# Patient Record
Sex: Male | Born: 1995 | Race: Black or African American | Hispanic: No | Marital: Single | Smoking: Never smoker
Health system: Southern US, Community
[De-identification: ages and names within clinical notes are randomized; demographics above are authoritative.]

## PROBLEM LIST (undated history)

## (undated) DIAGNOSIS — J9859 Other diseases of mediastinum, not elsewhere classified: Secondary | ICD-10-CM

---
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
## 2016-10-02 ENCOUNTER — Encounter: Payer: BLUE CROSS/BLUE SHIELD | Admitting: Emergency Medicine

## 2016-10-08 ENCOUNTER — Encounter: Payer: Self-pay | Admitting: Emergency Medicine

## 2016-10-08 ENCOUNTER — Ambulatory Visit (INDEPENDENT_AMBULATORY_CARE_PROVIDER_SITE_OTHER): Payer: BLUE CROSS/BLUE SHIELD | Admitting: Emergency Medicine

## 2016-10-08 ENCOUNTER — Encounter: Payer: BLUE CROSS/BLUE SHIELD | Admitting: Emergency Medicine

## 2016-10-08 VITALS — BP 114/68 | HR 63 | Temp 98.4°F | Resp 16 | Ht 75.0 in | Wt 184.0 lb

## 2016-10-08 DIAGNOSIS — Z0000 Encounter for general adult medical examination without abnormal findings: Secondary | ICD-10-CM | POA: Diagnosis not present

## 2016-10-08 NOTE — Patient Instructions (Addendum)
   IF you received an x-ray today, you will receive an invoice from [facility]. Please contact [facility] at [phone] with questions or concerns regarding your invoice.   IF you received labwork today, you will receive an invoice from [facility]. Please contact [facility] at [phone] with questions or concerns regarding your invoice.   Our billing staff will not be able to assist you with questions regarding bills from these companies.  You will be contacted with the lab results as soon as they are available. The fastest way to get your results is to activate your My Chart account. Instructions are located on the last page of this paperwork. If you have not heard from us regarding the results in 2 weeks, please contact this office.      Health Maintenance, Male A healthy lifestyle and preventive care is important for your health and wellness. Ask your health care provider about what schedule of regular examinations is right for you. What should I know about weight and diet? Eat a Healthy Diet  Eat plenty of vegetables, fruits, whole grains, low-fat dairy products, and lean protein.  Do not eat a lot of foods high in solid fats, added sugars, or salt.  Maintain a Healthy Weight Regular exercise can help you achieve or maintain a healthy weight. You should:  Do at least 150 minutes of exercise each week. The exercise should increase your heart rate and make you sweat (moderate-intensity exercise).  Do strength-training exercises at least twice a week.  Watch Your Levels of Cholesterol and Blood Lipids  Have your blood tested for lipids and cholesterol every 5 years starting at 20 years of age. If you are at high risk for heart disease, you should start having your blood tested when you are 20 years old. You may need to have your cholesterol levels checked more often if: ? Your lipid or cholesterol levels are high. ? You are older than 20 years of age. ? You  are at high risk for heart disease.  What should I know about cancer screening? Many types of cancers can be detected early and may often be prevented. Lung Cancer  You should be screened every year for lung cancer if: ? You are a current smoker who has smoked for at least 30 years. ? You are a former smoker who has quit within the past 15 years.  Talk to your health care provider about your screening options, when you should start screening, and how often you should be screened.  Colorectal Cancer  Routine colorectal cancer screening usually begins at 20 years of age and should be repeated every 5-10 years until you are 20 years old. You may need to be screened more often if early forms of precancerous polyps or small growths are found. Your health care provider may recommend screening at an earlier age if you have risk factors for colon cancer.  Your health care provider may recommend using home test kits to check for hidden blood in the stool.  A small camera at the end of a tube can be used to examine your colon (sigmoidoscopy or colonoscopy). This checks for the earliest forms of colorectal cancer.  Prostate and Testicular Cancer  Depending on your age and overall health, your health care provider may do certain tests to screen for prostate and testicular cancer.  Talk to your health care provider about any symptoms or concerns you have about testicular or prostate cancer.  Skin Cancer  Check your skin   your skin from head to toe regularly.  Tell your health care provider about any new moles or changes in moles, especially if: ? There is a change in a mole's size, shape, or color. ? You have a mole that is larger than a pencil eraser.  Always use sunscreen. Apply sunscreen liberally and repeat throughout the day.  Protect yourself by wearing long sleeves, pants, a wide-brimmed hat, and sunglasses when outside.  What should I know about heart disease, diabetes, and high  blood pressure?  If you are 7-68 years of age, have your blood pressure checked every 3-5 years. If you are 60 years of age or older, have your blood pressure checked every year. You should have your blood pressure measured twice-once when you are at a hospital or clinic, and once when you are not at a hospital or clinic. Record the average of the two measurements. To check your blood pressure when you are not at a hospital or clinic, you can use: ? An automated blood pressure machine at a pharmacy. ? A home blood pressure monitor.  Talk to your health care provider about your target blood pressure.  If you are between 34-21 years old, ask your health care provider if you should take aspirin to prevent heart disease.  Have regular diabetes screenings by checking your fasting blood sugar level. ? If you are at a normal weight and have a low risk for diabetes, have this test once every three years after the age of 28. ? If you are overweight and have a high risk for diabetes, consider being tested at a younger age or more often.  A one-time screening for abdominal aortic aneurysm (AAA) by ultrasound is recommended for men aged 58-75 years who are current or former smokers. What should I know about preventing infection? Hepatitis B If you have a higher risk for hepatitis B, you should be screened for this virus. Talk with your health care provider to find out if you are at risk for hepatitis B infection. Hepatitis C Blood testing is recommended for:  Everyone born from [birth year] through 1965.  Anyone with known risk factors for hepatitis C.  Sexually Transmitted Diseases (STDs)  You should be screened each year for STDs including gonorrhea and chlamydia if: ? You are sexually active and are younger than 20 years of age. ? You are older than 20 years of age and your health care provider tells you that you are at risk for this type of infection. ? Your sexual activity has changed since you were  last screened and you are at an increased risk for chlamydia or gonorrhea. Ask your health care provider if you are at risk.  Talk with your health care provider about whether you are at high risk of being infected with HIV. Your health care provider may recommend a prescription medicine to help prevent HIV infection.  What else can I do?  Schedule regular health, dental, and eye exams.  Stay current with your vaccines (immunizations).  Do not use any tobacco products, such as cigarettes, chewing tobacco, and e-cigarettes. If you need help quitting, ask your health care provider.  Limit alcohol intake to no more than 2 drinks per day. One drink equals 12 ounces of beer, 5 ounces of wine, or 1 ounces of hard liquor.  Do not use street drugs.  Do not share needles.  Ask your health care provider for help if you need support or information about quitting drugs.  Tell your  provider if you often feel depressed.  Tell your health care provider if you have ever been abused or do not feel safe at home. This information is not intended to replace advice given to you by your health care provider. Make sure you discuss any questions you have with your health care provider. Document Released: 08/15/2007 Document Revised: 10/16/2015 Document Reviewed: 11/20/2014 Elsevier Interactive Patient Education  2018 [employer] Inc.  American Heart Association (AHA) Exercise Recommendation  Being physically active is important to prevent heart disease and stroke, the nation's No. 1and No. 5killers. To improve overall cardiovascular health, we suggest at least 150 minutes per week of moderate exercise or 75 minutes per week of vigorous exercise (or a combination of moderate and vigorous activity). Thirty minutes a day, five times a week is an easy goal to remember. You will also experience benefits even if you divide your time into two or three segments of 10 to 15 minutes per day.  For people who would  benefit from lowering their blood pressure or cholesterol, we recommend 40 minutes of aerobic exercise of moderate to vigorous intensity three to four times a week to lower the risk for heart attack and stroke.  Physical activity is anything that makes you move your body and burn calories.  This includes things like climbing stairs or playing sports. Aerobic exercises benefit your heart, and include walking, jogging, swimming or biking. Strength and stretching exercises are best for overall stamina and flexibility.  The simplest, positive change you can make to effectively improve your heart health is to start walking. It's enjoyable, free, easy, social and great exercise. A walking program is flexible and boasts high success rates because people can stick with it. It's easy for walking to become a regular and satisfying part of life.   For Overall Cardiovascular Health:  At least 30 minutes of moderate-intensity aerobic activity at least 5 days per week for a total of 150  OR   At least 25 minutes of vigorous aerobic activity at least 3 days per week for a total of 75 minutes; or a combination of moderate- and vigorous-intensity aerobic activity  AND   Moderate- to high-intensity muscle-strengthening activity at least 2 days per week for additional health benefits.  For Lowering Blood Pressure and Cholesterol  An average 40 minutes of moderate- to vigorous-intensity aerobic activity 3 or 4 times per week  What if I can't make it to the time goal? Something is always better than nothing! And everyone has to start somewhere. Even if you've been sedentary for years, today is the day you can begin to make healthy changes in your life. If you don't think you'll make it for 30 or 40 minutes, set a reachable goal for today. You can work up toward your overall goal by increasing your time as you get stronger. Don't let all-or-nothing thinking rob you of doing what you can every day.   Source:http://www.heart.org    

## 2016-10-08 NOTE — Progress Notes (Signed)
Fernando Hickman 21 y.o.   Chief Complaint  Patient presents with  . Annual Exam    HISTORY OF PRESENT ILLNESS: This is a 21 y.o. male here for annual exam.  HPI   Prior to Admission medications   Not on File    No Known Allergies  There are no active problems to display for this patient.   No past medical history on file.  No past surgical history on file.  Social History   Social History  . Marital status: Single    Spouse name: N/A  . Number of children: N/A  . Years of education: N/A   Occupational History  . Not on file.   Social History Main Topics  . Smoking status: Never Smoker  . Smokeless tobacco: Never Used  . Alcohol use No  . Drug use: No  . Sexual activity: Not on file   Other Topics Concern  . Not on file   Social History Narrative  . No narrative on file    Family History  Problem Relation Age of Onset  . Hypertension Mother   . Hypertension Father      Review of Systems  Constitutional: Negative.  Negative for fever and weight loss.  HENT: Negative.  Negative for nosebleeds and sore throat.   Eyes: Negative.   Respiratory: Negative.  Negative for cough and shortness of breath.   Cardiovascular: Negative.  Negative for chest pain.  Gastrointestinal: Positive for abdominal pain (ocassional). Negative for blood in stool, diarrhea, melena, nausea and vomiting.  Genitourinary: Negative.  Negative for dysuria and hematuria.  Musculoskeletal: Negative.   Skin: Negative.  Negative for rash.  Neurological: Negative.  Negative for dizziness, weakness and headaches.  Endo/Heme/Allergies: Negative.   All other systems reviewed and are negative.    Vitals:   10/08/16 1201  BP: 114/68  Pulse: 63  Resp: 16  Temp: 98.4 F (36.9 C)  SpO2: 98%     Physical Exam  Constitutional: He is oriented to person, place, and time. He appears well-developed and well-nourished.  HENT:  Head: Normocephalic.  Right Ear: External ear normal.    Left Ear: External ear normal.  Nose: Nose normal.  Mouth/Throat: Oropharynx is clear and moist.  Eyes: Pupils are equal, round, and reactive to light. Conjunctivae and EOM are normal.  Neck: Normal range of motion. Neck supple. No JVD present. No thyromegaly present.  Cardiovascular: Normal rate, regular rhythm, normal heart sounds and intact distal pulses.   Pulmonary/Chest: Effort normal and breath sounds normal.  Abdominal: Soft. Bowel sounds are normal. There is no tenderness. There is no guarding.  Musculoskeletal: Normal range of motion.  Neurological: He is alert and oriented to person, place, and time. No sensory deficit. He exhibits normal muscle tone.  Skin: Skin is warm and dry. Capillary refill takes less than 2 seconds.  Psychiatric: He has a normal mood and affect. His behavior is normal.  Vitals reviewed.    ASSESSMENT & PLAN: Fernando Hickman was seen today for annual exam.  Diagnoses and all orders for this visit:  Routine general medical examination at a health care facility -     CBC with Differential -     Comprehensive metabolic panel -     Hemoglobin A1c -     Lipid panel -     TSH -     HIV antibody -     Urine Microscopic    Patient Instructions       IF you received  an x-ray today, you will receive an invoice from [facility]. Please contact [facility] at [phone] with questions or concerns regarding your invoice.   IF you received labwork today, you will receive an invoice from [facility]. Please contact [facility] at [phone] with questions or concerns regarding your invoice.   Our billing staff will not be able to assist you with questions regarding bills from these companies.  You will be contacted with the lab results as soon as they are available. The fastest way to get your results is to activate your My Chart account. Instructions are located on the last page of this paperwork. If you have not heard from [place] regarding the  results in 2 weeks, please contact this office.        Health Maintenance, Male A healthy lifestyle and preventive care is important for your health and wellness. Ask your health care provider about what schedule of regular examinations is right for you. What should I know about weight and diet? Eat a Healthy Diet  Eat plenty of vegetables, fruits, whole grains, low-fat dairy products, and lean protein.  Do not eat a lot of foods high in solid fats, added sugars, or salt.  Maintain a Healthy Weight Regular exercise can help you achieve or maintain a healthy weight. You should:  Do at least 150 minutes of exercise each week. The exercise should increase your heart rate and make you sweat (moderate-intensity exercise).  Do strength-training exercises at least twice a week.  Watch Your Levels of Cholesterol and Blood Lipids  Have your blood tested for lipids and cholesterol every 5 years starting at 20 years of age. If you are at high risk for heart disease, you should start having your blood tested when you are 20 years old. You may need to have your cholesterol levels checked more often if: ? Your lipid or cholesterol levels are high. ? You are older than 20 years of age. ? You are at high risk for heart disease.  What should I know about cancer screening? Many types of cancers can be detected early and may often be prevented. Lung Cancer  You should be screened every year for lung cancer if: ? You are a current smoker who has smoked for at least 30 years. ? You are a former smoker who has quit within the past 15 years.  Talk to your health care provider about your screening options, when you should start screening, and how often you should be screened.  Colorectal Cancer  Routine colorectal cancer screening usually begins at 20 years of age and should be repeated every 5-10 years until you are 19 years old. You may need to be screened more often if early forms of  precancerous polyps or small growths are found. Your health care provider may recommend screening at an earlier age if you have risk factors for colon cancer.  Your health care provider may recommend using home test kits to check for hidden blood in the stool.  A small camera at the end of a tube can be used to examine your colon (sigmoidoscopy or colonoscopy). This checks for the earliest forms of colorectal cancer.  Prostate and Testicular Cancer  Depending on your age and overall health, your health care provider may do certain tests to screen for prostate and testicular cancer.  Talk to your health care provider about any symptoms or concerns you have about testicular or prostate cancer.  Skin Cancer  Check your skin from head to  toe regularly.  Tell your health care provider about any new moles or changes in moles, especially if: ? There is a change in a mole's size, shape, or color. ? You have a mole that is larger than a pencil eraser.  Always use sunscreen. Apply sunscreen liberally and repeat throughout the day.  Protect yourself by wearing long sleeves, pants, a wide-brimmed hat, and sunglasses when outside.  What should I know about heart disease, diabetes, and high blood pressure?  If you are 68-21 years of age, have your blood pressure checked every 3-5 years. If you are 68 years of age or older, have your blood pressure checked every year. You should have your blood pressure measured twice-once when you are at a hospital or clinic, and once when you are not at a hospital or clinic. Record the average of the two measurements. To check your blood pressure when you are not at a hospital or clinic, you can use: ? An automated blood pressure machine at a pharmacy. ? A home blood pressure monitor.  Talk to your health care provider about your target blood pressure.  If you are between 93-64 years old, ask your health care provider if you should take aspirin to prevent heart  disease.  Have regular diabetes screenings by checking your fasting blood sugar level. ? If you are at a normal weight and have a low risk for diabetes, have this test once every three years after the age of 40. ? If you are overweight and have a high risk for diabetes, consider being tested at a younger age or more often.  A one-time screening for abdominal aortic aneurysm (AAA) by ultrasound is recommended for men aged 49-75 years who are current or former smokers. What should I know about preventing infection? Hepatitis B If you have a higher risk for hepatitis B, you should be screened for this virus. Talk with your health care provider to find out if you are at risk for hepatitis B infection. Hepatitis C Blood testing is recommended for:  Everyone born from [birth year] through 1964.  Anyone with known risk factors for hepatitis C.  Sexually Transmitted Diseases (STDs)  You should be screened each year for STDs including gonorrhea and chlamydia if: ? You are sexually active and are younger than 20 years of age. ? You are older than 20 years of age and your health care provider tells you that you are at risk for this type of infection. ? Your sexual activity has changed since you were last screened and you are at an increased risk for chlamydia or gonorrhea. Ask your health care provider if you are at risk.  Talk with your health care provider about whether you are at high risk of being infected with HIV. Your health care provider may recommend a prescription medicine to help prevent HIV infection.  What else can I do?  Schedule regular health, dental, and eye exams.  Stay current with your vaccines (immunizations).  Do not use any tobacco products, such as cigarettes, chewing tobacco, and e-cigarettes. If you need help quitting, ask your health care provider.  Limit alcohol intake to no more than 2 drinks per day. One drink equals 12 ounces of beer, 5 ounces of wine, or 1 ounces of  hard liquor.  Do not use street drugs.  Do not share needles.  Ask your health care provider for help if you need support or information about quitting drugs.  Tell your health care provider if you  often feel depressed.  Tell your health care provider if you have ever been abused or do not feel safe at home. This information is not intended to replace advice given to you by your health care provider. Make sure you discuss any questions you have with your health care provider. Document Released: 08/15/2007 Document Revised: 10/16/2015 Document Reviewed: 11/20/2014 Elsevier Interactive Patient Education  2018 [place] (AHA) Exercise Recommendation  Being physically active is important to prevent heart disease and stroke, the nation's No. 1and No. 5killers. To improve overall cardiovascular health, we suggest at least 150 minutes per week of moderate exercise or 75 minutes per week of vigorous exercise (or a combination of moderate and vigorous activity). Thirty minutes a day, five times a week is an easy goal to remember. You will also experience benefits even if you divide your time into two or three segments of 10 to 15 minutes per day.  For people who would benefit from lowering their blood pressure or cholesterol, we recommend 40 minutes of aerobic exercise of moderate to vigorous intensity three to four times a week to lower the risk for heart attack and stroke.  Physical activity is anything that makes you move your body and burn calories.  This includes things like climbing stairs or playing sports. Aerobic exercises benefit your heart, and include walking, jogging, swimming or biking. Strength and stretching exercises are best for overall stamina and flexibility.  The simplest, positive change you can make to effectively improve your heart health is to start walking. It's enjoyable, free, easy, social and great exercise. A walking program is flexible  and boasts high success rates because people can stick with it. It's easy for walking to become a regular and satisfying part of life.   For Overall Cardiovascular Health:  At least 30 minutes of moderate-intensity aerobic activity at least 5 days per week for a total of 150  OR   At least 25 minutes of vigorous aerobic activity at least 3 days per week for a total of 75 minutes; or a combination of moderate- and vigorous-intensity aerobic activity  AND   Moderate- to high-intensity muscle-strengthening activity at least 2 days per week for additional health benefits.  For Lowering Blood Pressure and Cholesterol  An average 40 minutes of moderate- to vigorous-intensity aerobic activity 3 or 4 times per week  What if I can't make it to the time goal? Something is always better than nothing! And everyone has to start somewhere. Even if you've been sedentary for years, today is the day you can begin to make healthy changes in your life. If you don't think you'll make it for 30 or 40 minutes, set a reachable goal for today. You can work up toward your overall goal by increasing your time as you get stronger. Don't let all-or-nothing thinking rob you of doing what you can every day.  Source:http://www.heart.Burnadette Pop, MD [facility]

## 2016-10-09 LAB — COMPREHENSIVE METABOLIC PANEL
ALBUMIN: 5.2 g/dL (ref 3.5–5.5)
ALK PHOS: 61 IU/L (ref 39–117)
ALT: 11 IU/L (ref 0–44)
AST: 18 IU/L (ref 0–40)
Albumin/Globulin Ratio: 2.3 — ABNORMAL HIGH (ref 1.2–2.2)
BILIRUBIN TOTAL: 0.9 mg/dL (ref 0.0–1.2)
BUN / CREAT RATIO: 10 (ref 9–20)
BUN: 10 mg/dL (ref 6–20)
CO2: 27 mmol/L (ref 20–29)
Calcium: 10 mg/dL (ref 8.7–10.2)
Chloride: 100 mmol/L (ref 96–106)
Creatinine, Ser: 0.97 mg/dL (ref 0.76–1.27)
GFR calc Af Amer: 130 mL/min/{1.73_m2} (ref >59)
GFR calc non Af Amer: 113 mL/min/{1.73_m2} (ref >59)
GLOBULIN, TOTAL: 2.3 g/dL (ref 1.5–4.5)
GLUCOSE: 88 mg/dL (ref 65–99)
Potassium: 4.6 mmol/L (ref 3.5–5.2)
Sodium: 140 mmol/L (ref 134–144)
Total Protein: 7.5 g/dL (ref 6.0–8.5)

## 2016-10-09 LAB — CBC WITH DIFFERENTIAL/PLATELET
BASOS ABS: 0 10*3/uL (ref 0.0–0.2)
Basos: 0 %
EOS (ABSOLUTE): 0.2 10*3/uL (ref 0.0–0.4)
Eos: 3 %
HEMATOCRIT: 44.6 % (ref 37.5–51.0)
Hemoglobin: 15 g/dL (ref 13.0–17.7)
Immature Grans (Abs): 0 10*3/uL (ref 0.0–0.1)
Immature Granulocytes: 0 %
LYMPHS ABS: 2.6 10*3/uL (ref 0.7–3.1)
Lymphs: 43 %
MCH: 30.2 pg (ref 26.6–33.0)
MCHC: 33.6 g/dL (ref 31.5–35.7)
MCV: 90 fL (ref 79–97)
Monocytes Absolute: 0.4 10*3/uL (ref 0.1–0.9)
Monocytes: 6 %
NEUTROS ABS: 2.8 10*3/uL (ref 1.4–7.0)
Neutrophils: 48 %
Platelets: 226 10*3/uL (ref 150–379)
RBC: 4.97 x10E6/uL (ref 4.14–5.80)
RDW: 12.9 % (ref 12.3–15.4)
WBC: 5.9 10*3/uL (ref 3.4–10.8)

## 2016-10-09 LAB — LIPID PANEL
CHOL/HDL RATIO: 2.2 ratio (ref 0.0–5.0)
CHOLESTEROL TOTAL: 120 mg/dL (ref 100–169)
HDL: 54 mg/dL (ref >39)
LDL CALC: 54 mg/dL (ref 0–109)
TRIGLYCERIDES: 58 mg/dL (ref 0–89)
VLDL Cholesterol Cal: 12 mg/dL (ref 5–40)

## 2016-10-09 LAB — URINALYSIS, MICROSCOPIC ONLY: CASTS: NONE SEEN /LPF

## 2016-10-09 LAB — HIV ANTIBODY (ROUTINE TESTING W REFLEX): HIV Screen 4th Generation wRfx: NONREACTIVE

## 2016-10-09 LAB — TSH: TSH: 1.84 u[IU]/mL (ref 0.450–4.500)

## 2016-10-09 LAB — HEMOGLOBIN A1C
Est. average glucose Bld gHb Est-mCnc: 94 mg/dL
Hgb A1c MFr Bld: 4.9 % (ref 4.8–5.6)

## 2018-03-18 ENCOUNTER — Encounter: Payer: Self-pay | Admitting: Emergency Medicine

## 2018-03-18 ENCOUNTER — Other Ambulatory Visit: Payer: Self-pay

## 2018-03-18 ENCOUNTER — Ambulatory Visit (INDEPENDENT_AMBULATORY_CARE_PROVIDER_SITE_OTHER): Payer: BLUE CROSS/BLUE SHIELD | Admitting: Emergency Medicine

## 2018-03-18 VITALS — BP 123/65 | HR 49 | Temp 98.6°F | Resp 16 | Ht 74.0 in | Wt 180.2 lb

## 2018-03-18 DIAGNOSIS — Z1329 Encounter for screening for other suspected endocrine disorder: Secondary | ICD-10-CM

## 2018-03-18 DIAGNOSIS — Z0000 Encounter for general adult medical examination without abnormal findings: Secondary | ICD-10-CM | POA: Diagnosis not present

## 2018-03-18 DIAGNOSIS — Z13220 Encounter for screening for lipoid disorders: Secondary | ICD-10-CM

## 2018-03-18 DIAGNOSIS — Z13228 Encounter for screening for other metabolic disorders: Secondary | ICD-10-CM

## 2018-03-18 DIAGNOSIS — Z130 Encounter for screening for diseases of the blood and blood-forming organs and certain disorders involving the immune mechanism: Secondary | ICD-10-CM | POA: Diagnosis not present

## 2018-03-18 LAB — CMP14+EGFR
ALT: 12 IU/L (ref 0–44)
AST: 15 IU/L (ref 0–40)
Albumin/Globulin Ratio: 2 (ref 1.2–2.2)
Albumin: 4.9 g/dL (ref 3.5–5.5)
Alkaline Phosphatase: 55 IU/L (ref 39–117)
BUN/Creatinine Ratio: 12 (ref 9–20)
BUN: 11 mg/dL (ref 6–20)
Bilirubin Total: 0.5 mg/dL (ref 0.0–1.2)
CO2: 25 mmol/L (ref 20–29)
Calcium: 9.9 mg/dL (ref 8.7–10.2)
Chloride: 100 mmol/L (ref 96–106)
Creatinine, Ser: 0.94 mg/dL (ref 0.76–1.27)
GFR calc non Af Amer: 115 mL/min/{1.73_m2} (ref >59)
GFR, EST AFRICAN AMERICAN: 133 mL/min/{1.73_m2} (ref >59)
Globulin, Total: 2.5 g/dL (ref 1.5–4.5)
Glucose: 73 mg/dL (ref 65–99)
Potassium: 4.4 mmol/L (ref 3.5–5.2)
Sodium: 140 mmol/L (ref 134–144)
TOTAL PROTEIN: 7.4 g/dL (ref 6.0–8.5)

## 2018-03-18 LAB — CBC WITH DIFFERENTIAL/PLATELET
Basophils Absolute: 0 10*3/uL (ref 0.0–0.2)
Basos: 1 %
EOS (ABSOLUTE): 0.1 10*3/uL (ref 0.0–0.4)
EOS: 2 %
HEMATOCRIT: 41.3 % (ref 37.5–51.0)
Hemoglobin: 13.8 g/dL (ref 13.0–17.7)
IMMATURE GRANS (ABS): 0 10*3/uL (ref 0.0–0.1)
Immature Granulocytes: 0 %
Lymphocytes Absolute: 2 10*3/uL (ref 0.7–3.1)
Lymphs: 34 %
MCH: 29.9 pg (ref 26.6–33.0)
MCHC: 33.4 g/dL (ref 31.5–35.7)
MCV: 89 fL (ref 79–97)
Monocytes Absolute: 0.4 10*3/uL (ref 0.1–0.9)
Monocytes: 7 %
Neutrophils Absolute: 3.3 10*3/uL (ref 1.4–7.0)
Neutrophils: 56 %
Platelets: 236 10*3/uL (ref 150–450)
RBC: 4.62 x10E6/uL (ref 4.14–5.80)
RDW: 12 % (ref 11.6–15.4)
WBC: 5.8 10*3/uL (ref 3.4–10.8)

## 2018-03-18 NOTE — Patient Instructions (Addendum)

## 2018-03-18 NOTE — Progress Notes (Signed)
Fernando Hickman 23 y.o.   Chief Complaint  Patient presents with  . Annual Exam    HISTORY OF PRESENT ILLNESS: This is a 23 y.o. male here for his annual exam.  No complaints or medical concerns today.  No chronic medical problems.  No chronic medications.  Non-smoker and social EtOH user.  HPI   Prior to Admission medications   Medication Sig Start Date End Date Taking? Authorizing Provider  Ascorbic Acid (VITAMIN C PO) Take by mouth daily.   Yes [provider]  Multiple Vitamin (MULTIVITAMIN) tablet Take 1 tablet by mouth daily.   Yes [provider]    No Known Allergies  Patient Active Problem List   Diagnosis Date Noted  . Routine general medical examination at a health care facility 10/08/2016    No past medical history on file.  No past surgical history on file.  Social History   Socioeconomic History  . Marital status: Single    Spouse name: Not on file  . Number of children: Not on file  . Years of education: Not on file  . Highest education level: Not on file  Occupational History  . Not on file  Social Needs  . Financial resource strain: Not on file  . Food insecurity:    Worry: Not on file    Inability: Not on file  . Transportation needs:    Medical: Not on file    Non-medical: Not on file  Tobacco Use  . Smoking status: Never Smoker  . Smokeless tobacco: Never Used  Substance and Sexual Activity  . Alcohol use: No  . Drug use: No  . Sexual activity: Not on file  Lifestyle  . Physical activity:    Days per week: Not on file    Minutes per session: Not on file  . Stress: Not on file  Relationships  . Social connections:    Talks on phone: Not on file    Gets together: Not on file    Attends religious service: Not on file    Active member of club or organization: Not on file    Attends meetings of clubs or organizations: Not on file    Relationship status: Not on file  . Intimate partner violence:    Fear of current  or ex partner: Not on file    Emotionally abused: Not on file    Physically abused: Not on file    Forced sexual activity: Not on file  Other Topics Concern  . Not on file  Social History Narrative  . Not on file    Family History  Problem Relation Age of Onset  . Hypertension Mother   . Hypertension Father      Review of Systems  Constitutional: Negative.  Negative for chills, fever and weight loss.  HENT: Negative.  Negative for sinus pain and sore throat.   Eyes: Negative.  Negative for blurred vision and double vision.  Respiratory: Negative.  Negative for cough, hemoptysis, shortness of breath and wheezing.   Cardiovascular: Negative.  Negative for chest pain and palpitations.  Gastrointestinal: Negative.  Negative for abdominal pain, diarrhea, nausea and vomiting.  Genitourinary: Negative.  Negative for dysuria, frequency and hematuria.  Musculoskeletal: Negative.  Negative for back pain, myalgias and neck pain.  Skin: Negative.  Negative for rash.  Neurological: Negative.  Negative for dizziness and headaches.  Endo/Heme/Allergies: Negative.   All other systems reviewed and are negative.   Vitals:   03/18/18 1054  BP: 123/65  Pulse: (!) 49  Resp: 16  Temp: 98.6 F (37 C)  SpO2: 98%    Physical Exam Vitals signs reviewed.  Constitutional:      Appearance: Normal appearance.  HENT:     Head: Normocephalic and atraumatic.     Right Ear: External ear normal.     Left Ear: External ear normal.     Nose: Nose normal.     Mouth/Throat:     Mouth: Mucous membranes are moist.     Pharynx: Oropharynx is clear.  Eyes:     Extraocular Movements: Extraocular movements intact.     Conjunctiva/sclera: Conjunctivae normal.     Pupils: Pupils are equal, round, and reactive to light.  Neck:     Musculoskeletal: Normal range of motion and neck supple.  Cardiovascular:     Rate and Rhythm: Normal rate and regular rhythm.     Pulses: Normal pulses.     Heart sounds:  Normal heart sounds.  Pulmonary:     Effort: Pulmonary effort is normal.     Breath sounds: Normal breath sounds.  Abdominal:     General: There is no distension.     Palpations: Abdomen is soft.     Tenderness: There is no abdominal tenderness.  Musculoskeletal: Normal range of motion.  Skin:    General: Skin is warm and dry.     Capillary Refill: Capillary refill takes less than 2 seconds.  Neurological:     General: No focal deficit present.     Mental Status: He is alert and oriented to person, place, and time.     Sensory: No sensory deficit.     Motor: No weakness.  Psychiatric:        Mood and Affect: Mood normal.        Behavior: Behavior normal.      ASSESSMENT & PLAN: Fernando Hickman was seen today for annual exam.  Diagnoses and all orders for this visit:  Routine general medical examination at a health care facility -     CBC with Differential/Platelet -     Cancel: COMPLETE METABOLIC PANEL WITH GFR -     CMP14+EGFR  Screening for deficiency anemia -     CBC with Differential/Platelet  Screening for lipoid disorders  Screening for endocrine, metabolic and immunity disorder -     CBC with Differential/Platelet -     Cancel: COMPLETE METABOLIC PANEL WITH GFR -     CMP14+EGFR    Patient Instructions       If you have lab work done today you will be contacted with your lab results within the next 2 weeks.  If you have not heard from [place] then please contact us. The fastest way to get your results is to register for My Chart.   IF you received an x-ray today, you will receive an invoice from [facility]. Please contact [facility] at [phone] with questions or concerns regarding your invoice.   IF you received labwork today, you will receive an invoice from [facility]. Please contact [facility] at [phone] with questions or concerns regarding your invoice.   Our billing staff will not be able to assist you with questions regarding bills  from these companies.  You will be contacted with the lab results as soon as they are available. The fastest way to get your results is to activate your My Chart account. Instructions are located on the last page of this paperwork. If you have not heard from [place]  regarding the results in 2 weeks, please contact this office.      Health Maintenance, Male A healthy lifestyle and preventive care is important for your health and wellness. Ask your health care provider about what schedule of regular examinations is right for you. What should I know about weight and diet? Eat a Healthy Diet  Eat plenty of vegetables, fruits, whole grains, low-fat dairy products, and lean protein.  Do not eat a lot of foods high in solid fats, added sugars, or salt.  Maintain a Healthy Weight Regular exercise can help you achieve or maintain a healthy weight. You should:  Do at least 150 minutes of exercise each week. The exercise should increase your heart rate and make you sweat (moderate-intensity exercise).  Do strength-training exercises at least twice a week. Watch Your Levels of Cholesterol and Blood Lipids  Have your blood tested for lipids and cholesterol every 5 years starting at 22 years of age. If you are at high risk for heart disease, you should start having your blood tested when you are 22 years old. You may need to have your cholesterol levels checked more often if: ? Your lipid or cholesterol levels are high. ? You are older than 22 years of age. ? You are at high risk for heart disease. What should I know about cancer screening? Many types of cancers can be detected early and may often be prevented. Lung Cancer  You should be screened every year for lung cancer if: ? You are a current smoker who has smoked for at least 30 years. ? You are a former smoker who has quit within the past 15 years.  Talk to your health care provider about your screening options, when you should start  screening, and how often you should be screened. Colorectal Cancer  Routine colorectal cancer screening usually begins at 22 years of age and should be repeated every 5-10 years until you are 22 years old. You may need to be screened more often if early forms of precancerous polyps or small growths are found. Your health care provider may recommend screening at an earlier age if you have risk factors for colon cancer.  Your health care provider may recommend using home test kits to check for hidden blood in the stool.  A small camera at the end of a tube can be used to examine your colon (sigmoidoscopy or colonoscopy). This checks for the earliest forms of colorectal cancer. Prostate and Testicular Cancer  Depending on your age and overall health, your health care provider may do certain tests to screen for prostate and testicular cancer.  Talk to your health care provider about any symptoms or concerns you have about testicular or prostate cancer. Skin Cancer  Check your skin from head to toe regularly.  Tell your health care provider about any new moles or changes in moles, especially if: ? There is a change in a mole's size, shape, or color. ? You have a mole that is larger than a pencil eraser.  Always use sunscreen. Apply sunscreen liberally and repeat throughout the day.  Protect yourself by wearing long sleeves, pants, a wide-brimmed hat, and sunglasses when outside. What should I know about heart disease, diabetes, and high blood pressure?  If you are 32-58 years of age, have your blood pressure checked every 3-5 years. If you are 74 years of age or older, have your blood pressure checked every year. You should have your blood pressure measured twice-once when you  are at a hospital or clinic, and once when you are not at a hospital or clinic. Record the average of the two measurements. To check your blood pressure when you are not at a hospital or clinic, you can use: ? An  automated blood pressure machine at a pharmacy. ? A home blood pressure monitor.  Talk to your health care provider about your target blood pressure.  If you are between 56-44 years old, ask your health care provider if you should take aspirin to prevent heart disease.  Have regular diabetes screenings by checking your fasting blood sugar level. ? If you are at a normal weight and have a low risk for diabetes, have this test once every three years after the age of 37. ? If you are overweight and have a high risk for diabetes, consider being tested at a younger age or more often.  A one-time screening for abdominal aortic aneurysm (AAA) by ultrasound is recommended for men aged 57-75 years who are current or former smokers. What should I know about preventing infection? Hepatitis B If you have a higher risk for hepatitis B, you should be screened for this virus. Talk with your health care provider to find out if you are at risk for hepatitis B infection. Hepatitis C Blood testing is recommended for:  Everyone born from [birth year] through 1964.  Anyone with known risk factors for hepatitis C. Sexually Transmitted Diseases (STDs)  You should be screened each year for STDs including gonorrhea and chlamydia if: ? You are sexually active and are younger than 22 years of age. ? You are older than 22 years of age and your health care provider tells you that you are at risk for this type of infection. ? Your sexual activity has changed since you were last screened and you are at an increased risk for chlamydia or gonorrhea. Ask your health care provider if you are at risk.  Talk with your health care provider about whether you are at high risk of being infected with HIV. Your health care provider may recommend a prescription medicine to help prevent HIV infection. What else can I do?  Schedule regular health, dental, and eye exams.  Stay current with your vaccines (immunizations).  Do not use any  tobacco products, such as cigarettes, chewing tobacco, and e-cigarettes. If you need help quitting, ask your health care provider.  Limit alcohol intake to no more than 2 drinks per day. One drink equals 12 ounces of beer, 5 ounces of wine, or 1 ounces of hard liquor.  Do not use street drugs.  Do not share needles.  Ask your health care provider for help if you need support or information about quitting drugs.  Tell your health care provider if you often feel depressed.  Tell your health care provider if you have ever been abused or do not feel safe at home. This information is not intended to replace advice given to you by your health care provider. Make sure you discuss any questions you have with your health care provider. Document Released: 08/15/2007 Document Revised: 10/16/2015 Document Reviewed: 11/20/2014 Elsevier Interactive Patient Education  2019 Elsevier Inc.      Agustina Caroli, MD [facility]

## 2019-07-27 ENCOUNTER — Ambulatory Visit: Payer: Self-pay | Attending: Internal Medicine

## 2019-07-27 DIAGNOSIS — Z23 Encounter for immunization: Secondary | ICD-10-CM

## 2019-07-27 NOTE — Progress Notes (Signed)
   Covid-19 Vaccination Clinic  Name:  STEVESON BOLING    MRN: [id number] DOB: 1996-01-01  07/27/2019  Mr. Zamarron was observed post Covid-19 immunization for 15 minutes without incident. He was provided with Vaccine Information Sheet and instruction to access the V-Safe system.   Mr. Wolbert was instructed to call 911 with any severe reactions post vaccine: Marland Kitchen Difficulty breathing  . Swelling of face and throat  . A fast heartbeat  . A bad rash all over body  . Dizziness and weakness   Immunizations Administered    Name Date Dose VIS Date Route   Pfizer COVID-19 Vaccine 07/27/2019  9:41 AM 0.3 mL 04/26/2018 Intramuscular   Manufacturer: [employer], [employer]   Lot: [id number]   [place]: [id number]

## 2019-08-21 ENCOUNTER — Ambulatory Visit: Payer: Self-pay | Attending: Internal Medicine

## 2019-08-21 DIAGNOSIS — Z23 Encounter for immunization: Secondary | ICD-10-CM

## 2019-08-21 NOTE — Progress Notes (Signed)
   Covid-19 Vaccination Clinic  Name:  Fernando Hickman    MRN: [id number] DOB: Feb 20, 1996  08/21/2019  Mr. Mcclenathan was observed post Covid-19 immunization for 30 minutes based on pre-vaccination screening without incident. He was provided with Vaccine Information Sheet and instruction to access the V-Safe system.   Mr. Tory was instructed to call 911 with any severe reactions post vaccine: Marland Kitchen Difficulty breathing  . Swelling of face and throat  . A fast heartbeat  . A bad rash all over body  . Dizziness and weakness   Immunizations Administered    Name Date Dose VIS Date Route   Pfizer COVID-19 Vaccine 08/21/2019  3:51 PM 0.3 mL 04/26/2018 Intramuscular   Manufacturer: [employer], [employer]   Lot: [id number]   [place]: [id number]

## 2019-09-07 ENCOUNTER — Other Ambulatory Visit: Payer: Self-pay

## 2019-09-07 ENCOUNTER — Emergency Department (HOSPITAL_COMMUNITY): Payer: BLUE CROSS/BLUE SHIELD

## 2019-09-07 ENCOUNTER — Emergency Department (HOSPITAL_COMMUNITY)
Admission: EM | Admit: 2019-09-07 | Discharge: 2019-09-07 | Disposition: A | Discharge status: Home / Self Care | Payer: BLUE CROSS/BLUE SHIELD | Attending: Emergency Medicine | Admitting: Emergency Medicine

## 2019-09-07 DIAGNOSIS — S0101XA Laceration without foreign body of scalp, initial encounter: Secondary | ICD-10-CM | POA: Diagnosis not present

## 2019-09-07 DIAGNOSIS — Y999 Unspecified external cause status: Secondary | ICD-10-CM | POA: Diagnosis not present

## 2019-09-07 DIAGNOSIS — S161XXA Strain of muscle, fascia and tendon at neck level, initial encounter: Secondary | ICD-10-CM | POA: Diagnosis not present

## 2019-09-07 DIAGNOSIS — T148XXA Other injury of unspecified body region, initial encounter: Secondary | ICD-10-CM | POA: Diagnosis not present

## 2019-09-07 DIAGNOSIS — Y9389 Activity, other specified: Secondary | ICD-10-CM | POA: Insufficient documentation

## 2019-09-07 DIAGNOSIS — Z23 Encounter for immunization: Secondary | ICD-10-CM | POA: Insufficient documentation

## 2019-09-07 DIAGNOSIS — S01511A Laceration without foreign body of lip, initial encounter: Secondary | ICD-10-CM | POA: Diagnosis not present

## 2019-09-07 DIAGNOSIS — Y92411 Interstate highway as the place of occurrence of the external cause: Secondary | ICD-10-CM | POA: Diagnosis not present

## 2019-09-07 DIAGNOSIS — S199XXA Unspecified injury of neck, initial encounter: Secondary | ICD-10-CM | POA: Diagnosis present

## 2019-09-07 DIAGNOSIS — R9389 Abnormal findings on diagnostic imaging of other specified body structures: Secondary | ICD-10-CM

## 2019-09-07 DIAGNOSIS — M549 Dorsalgia, unspecified: Secondary | ICD-10-CM | POA: Diagnosis not present

## 2019-09-07 LAB — URINALYSIS, ROUTINE W REFLEX MICROSCOPIC
Bilirubin Urine: NEGATIVE
Glucose, UA: NEGATIVE mg/dL
Hgb urine dipstick: NEGATIVE
Ketones, ur: 20 mg/dL — AB
Leukocytes,Ua: NEGATIVE
Nitrite: NEGATIVE
Protein, ur: NEGATIVE mg/dL
Specific Gravity, Urine: 1.012 (ref 1.005–1.030)
pH: 6 (ref 5.0–8.0)

## 2019-09-07 LAB — COMPREHENSIVE METABOLIC PANEL
ALT: 13 U/L (ref 0–44)
AST: 26 U/L (ref 15–41)
Albumin: 4.9 g/dL (ref 3.5–5.0)
Alkaline Phosphatase: 43 U/L (ref 38–126)
Anion gap: 15 (ref 5–15)
BUN: 15 mg/dL (ref 6–20)
CO2: 20 mmol/L — ABNORMAL LOW (ref 22–32)
Calcium: 10 mg/dL (ref 8.9–10.3)
Chloride: 102 mmol/L (ref 98–111)
Creatinine, Ser: 1.21 mg/dL (ref 0.61–1.24)
GFR calc Af Amer: >60 mL/min (ref >60)
GFR calc non Af Amer: >60 mL/min (ref >60)
Glucose, Bld: 86 mg/dL (ref 70–99)
Potassium: 3.9 mmol/L (ref 3.5–5.1)
Sodium: 137 mmol/L (ref 135–145)
Total Bilirubin: 1.2 mg/dL (ref 0.3–1.2)
Total Protein: 8.1 g/dL (ref 6.5–8.1)

## 2019-09-07 LAB — I-STAT CHEM 8, ED
BUN: 16 mg/dL (ref 6–20)
Calcium, Ion: 1.14 mmol/L — ABNORMAL LOW (ref 1.15–1.40)
Chloride: 102 mmol/L (ref 98–111)
Creatinine, Ser: 1.2 mg/dL (ref 0.61–1.24)
Glucose, Bld: 84 mg/dL (ref 70–99)
HCT: 44 % (ref 39.0–52.0)
Hemoglobin: 15 g/dL (ref 13.0–17.0)
Potassium: 3.7 mmol/L (ref 3.5–5.1)
Sodium: 138 mmol/L (ref 135–145)
TCO2: 22 mmol/L (ref 22–32)

## 2019-09-07 LAB — CBC
HCT: 43 % (ref 39.0–52.0)
Hemoglobin: 14.2 g/dL (ref 13.0–17.0)
MCH: 29.8 pg (ref 26.0–34.0)
MCHC: 33 g/dL (ref 30.0–36.0)
MCV: 90.1 fL (ref 80.0–100.0)
Platelets: 199 10*3/uL (ref 150–400)
RBC: 4.77 MIL/uL (ref 4.22–5.81)
RDW: 11.7 % (ref 11.5–15.5)
WBC: 11.3 10*3/uL — ABNORMAL HIGH (ref 4.0–10.5)
nRBC: 0 % (ref 0.0–0.2)

## 2019-09-07 LAB — PROTIME-INR
INR: 1.1 (ref 0.8–1.2)
Prothrombin Time: 13.6 seconds (ref 11.4–15.2)

## 2019-09-07 LAB — LACTIC ACID, PLASMA: Lactic Acid, Venous: 1.7 mmol/L (ref 0.5–1.9)

## 2019-09-07 LAB — ETHANOL: Alcohol, Ethyl (B): <10 mg/dL (ref <10)

## 2019-09-07 MED ORDER — CYCLOBENZAPRINE HCL 10 MG PO TABS
10.0000 mg | ORAL_TABLET | Freq: Two times a day (BID) | ORAL | 0 refills | Status: DC | PRN
Start: 2019-09-07 — End: 2020-02-16

## 2019-09-07 MED ORDER — IOHEXOL 300 MG/ML  SOLN
100.0000 mL | Freq: Once | INTRAMUSCULAR | Status: AC | PRN
  Administered 2019-09-07: 100 mL via INTRAVENOUS

## 2019-09-07 MED ORDER — ONDANSETRON HCL 4 MG/2ML IJ SOLN
4.0000 mg | Freq: Once | INTRAMUSCULAR | Status: AC
  Administered 2019-09-07: 4 mg via INTRAVENOUS
  Filled 2019-09-07: qty 2

## 2019-09-07 MED ORDER — IBUPROFEN 600 MG PO TABS
600.0000 mg | ORAL_TABLET | Freq: Four times a day (QID) | ORAL | 0 refills | Status: DC | PRN
Start: 2019-09-07 — End: 2020-02-16

## 2019-09-07 MED ORDER — FENTANYL CITRATE (PF) 100 MCG/2ML IJ SOLN
50.0000 ug | Freq: Once | INTRAMUSCULAR | Status: AC
  Administered 2019-09-07: 50 ug via INTRAVENOUS
  Filled 2019-09-07: qty 2

## 2019-09-07 MED ORDER — TETANUS-DIPHTH-ACELL PERTUSSIS 5-2.5-18.5 LF-MCG/0.5 IM SUSP
0.5000 mL | Freq: Once | INTRAMUSCULAR | Status: AC
  Administered 2019-09-07: 0.5 mL via INTRAMUSCULAR
  Filled 2019-09-07: qty 0.5
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
## 2019-09-07 NOTE — ED Notes (Signed)
Pt transported to CT ?

## 2020-02-12 ENCOUNTER — Other Ambulatory Visit: Payer: Self-pay

## 2020-02-12 ENCOUNTER — Inpatient Hospital Stay (HOSPITAL_COMMUNITY)
Admission: EM | Admit: 2020-02-12 | Discharge: 2020-02-16 | DRG: 820 | Disposition: A | Discharge status: Other Acute Inpatient Hospital | Payer: BC Managed Care – PPO | Attending: Emergency Medicine | Admitting: Emergency Medicine

## 2020-02-12 DIAGNOSIS — D72829 Elevated white blood cell count, unspecified: Secondary | ICD-10-CM

## 2020-02-12 DIAGNOSIS — Z20822 Contact with and (suspected) exposure to covid-19: Secondary | ICD-10-CM | POA: Diagnosis present

## 2020-02-12 DIAGNOSIS — C9100 Acute lymphoblastic leukemia not having achieved remission: Secondary | ICD-10-CM

## 2020-02-12 DIAGNOSIS — I3131 Malignant pericardial effusion in diseases classified elsewhere: Secondary | ICD-10-CM

## 2020-02-12 DIAGNOSIS — R634 Abnormal weight loss: Secondary | ICD-10-CM | POA: Diagnosis present

## 2020-02-12 DIAGNOSIS — J9601 Acute respiratory failure with hypoxia: Secondary | ICD-10-CM | POA: Diagnosis present

## 2020-02-12 DIAGNOSIS — R112 Nausea with vomiting, unspecified: Secondary | ICD-10-CM | POA: Diagnosis present

## 2020-02-12 DIAGNOSIS — R0602 Shortness of breath: Secondary | ICD-10-CM

## 2020-02-12 DIAGNOSIS — Z9889 Other specified postprocedural states: Secondary | ICD-10-CM

## 2020-02-12 DIAGNOSIS — R0902 Hypoxemia: Secondary | ICD-10-CM

## 2020-02-12 DIAGNOSIS — E875 Hyperkalemia: Secondary | ICD-10-CM | POA: Diagnosis not present

## 2020-02-12 DIAGNOSIS — J9859 Other diseases of mediastinum, not elsewhere classified: Secondary | ICD-10-CM | POA: Diagnosis present

## 2020-02-12 DIAGNOSIS — Z806 Family history of leukemia: Secondary | ICD-10-CM

## 2020-02-12 DIAGNOSIS — I471 Supraventricular tachycardia: Secondary | ICD-10-CM | POA: Diagnosis present

## 2020-02-12 DIAGNOSIS — Z801 Family history of malignant neoplasm of trachea, bronchus and lung: Secondary | ICD-10-CM

## 2020-02-12 DIAGNOSIS — Z8249 Family history of ischemic heart disease and other diseases of the circulatory system: Secondary | ICD-10-CM

## 2020-02-12 DIAGNOSIS — I313 Pericardial effusion (noninflammatory): Secondary | ICD-10-CM | POA: Diagnosis present

## 2020-02-12 DIAGNOSIS — E883 Tumor lysis syndrome: Secondary | ICD-10-CM

## 2020-02-12 DIAGNOSIS — R799 Abnormal finding of blood chemistry, unspecified: Secondary | ICD-10-CM

## 2020-02-12 DIAGNOSIS — Z807 Family history of other malignant neoplasms of lymphoid, hematopoietic and related tissues: Secondary | ICD-10-CM

## 2020-02-12 DIAGNOSIS — Z833 Family history of diabetes mellitus: Secondary | ICD-10-CM

## 2020-02-12 DIAGNOSIS — C9150 Adult T-cell lymphoma/leukemia (HTLV-1-associated) not having achieved remission: Principal | ICD-10-CM | POA: Diagnosis present

## 2020-02-12 DIAGNOSIS — J9621 Acute and chronic respiratory failure with hypoxia: Secondary | ICD-10-CM

## 2020-02-12 DIAGNOSIS — J90 Pleural effusion, not elsewhere classified: Secondary | ICD-10-CM | POA: Diagnosis present

## 2020-02-12 DIAGNOSIS — Z6822 Body mass index (BMI) 22.0-22.9, adult: Secondary | ICD-10-CM

## 2020-02-12 HISTORY — DX: Other diseases of mediastinum, not elsewhere classified: J98.59

## 2020-02-12 LAB — RESP PANEL BY RT-PCR (FLU A&B, COVID) ARPGX2
Influenza A by PCR: NEGATIVE
Influenza B by PCR: NEGATIVE
SARS Coronavirus 2 by RT PCR: NEGATIVE

## 2020-02-12 NOTE — ED Triage Notes (Signed)
Patient reports chest congestion with dry cough this week , seen by his PCP sent here for further evaluation due to abnormal chest x-ray result today , denies fever or chills , respirations unlabored .

## 2020-02-13 ENCOUNTER — Emergency Department (HOSPITAL_COMMUNITY): Payer: BC Managed Care – PPO

## 2020-02-13 ENCOUNTER — Other Ambulatory Visit: Payer: Self-pay

## 2020-02-13 ENCOUNTER — Encounter (HOSPITAL_COMMUNITY): Payer: Self-pay | Admitting: Oncology

## 2020-02-13 DIAGNOSIS — J9859 Other diseases of mediastinum, not elsewhere classified: Secondary | ICD-10-CM | POA: Diagnosis present

## 2020-02-13 DIAGNOSIS — J90 Pleural effusion, not elsewhere classified: Secondary | ICD-10-CM | POA: Diagnosis not present

## 2020-02-13 DIAGNOSIS — I313 Pericardial effusion (noninflammatory): Secondary | ICD-10-CM | POA: Diagnosis not present

## 2020-02-13 DIAGNOSIS — D72829 Elevated white blood cell count, unspecified: Secondary | ICD-10-CM

## 2020-02-13 LAB — CBC WITH DIFFERENTIAL/PLATELET
Band Neutrophils: 9 %
Basophils Absolute: 0 10*3/uL (ref 0.0–0.1)
Basophils Relative: 0 %
Blasts: 20 %
Eosinophils Absolute: 0 10*3/uL (ref 0.0–0.5)
Eosinophils Relative: 0 %
HCT: 45.5 % (ref 39.0–52.0)
Hemoglobin: 15.1 g/dL (ref 13.0–17.0)
Lymphocytes Relative: 15 %
Lymphs Abs: 4.3 10*3/uL — ABNORMAL HIGH (ref 0.7–4.0)
MCH: 28.8 pg (ref 26.0–34.0)
MCHC: 33.2 g/dL (ref 30.0–36.0)
MCV: 86.8 fL (ref 80.0–100.0)
Metamyelocytes Relative: 2 %
Monocytes Absolute: 0.9 10*3/uL (ref 0.1–1.0)
Monocytes Relative: 3 %
Myelocytes: 5 %
Neutro Abs: 23.3 10*3/uL — ABNORMAL HIGH (ref 1.7–7.7)
Neutrophils Relative %: 46 %
Platelets: 194 10*3/uL (ref 150–400)
Promyelocytes Relative: 0 %
RBC: 5.24 MIL/uL (ref 4.22–5.81)
RDW: 13.1 % (ref 11.5–15.5)
WBC: 28.4 10*3/uL — ABNORMAL HIGH (ref 4.0–10.5)
nRBC: 0 /100 WBC
nRBC: 0.1 % (ref 0.0–0.2)

## 2020-02-13 LAB — HEPATITIS B CORE ANTIBODY, TOTAL: Hep B Core Total Ab: NONREACTIVE

## 2020-02-13 LAB — COMPREHENSIVE METABOLIC PANEL
ALT: 31 U/L (ref 0–44)
AST: 29 U/L (ref 15–41)
Albumin: 4.2 g/dL (ref 3.5–5.0)
Alkaline Phosphatase: 54 U/L (ref 38–126)
Anion gap: 14 (ref 5–15)
BUN: 18 mg/dL (ref 6–20)
CO2: 23 mmol/L (ref 22–32)
Calcium: 10.3 mg/dL (ref 8.9–10.3)
Chloride: 100 mmol/L (ref 98–111)
Creatinine, Ser: 1.2 mg/dL (ref 0.61–1.24)
GFR, Estimated: >60 mL/min (ref >60)
Glucose, Bld: 84 mg/dL (ref 70–99)
Potassium: 5.1 mmol/L (ref 3.5–5.1)
Sodium: 137 mmol/L (ref 135–145)
Total Bilirubin: 1 mg/dL (ref 0.3–1.2)
Total Protein: 8 g/dL (ref 6.5–8.1)

## 2020-02-13 LAB — TSH: TSH: 1.701 u[IU]/mL (ref 0.350–4.500)

## 2020-02-13 LAB — HEPATITIS C ANTIBODY: HCV Ab: NONREACTIVE

## 2020-02-13 LAB — HIV ANTIBODY (ROUTINE TESTING W REFLEX): HIV Screen 4th Generation wRfx: NONREACTIVE

## 2020-02-13 LAB — LACTATE DEHYDROGENASE: LDH: 323 U/L — ABNORMAL HIGH (ref 98–192)

## 2020-02-13 LAB — SEDIMENTATION RATE: Sed Rate: 12 mm/hr (ref 0–16)

## 2020-02-13 LAB — HEPATITIS B SURFACE ANTIGEN: Hepatitis B Surface Ag: NONREACTIVE

## 2020-02-13 MED ORDER — LACTATED RINGERS IV BOLUS
1000.0000 mL | Freq: Once | INTRAVENOUS | Status: AC
  Administered 2020-02-13: 19:00:00 1000 mL via INTRAVENOUS

## 2020-02-13 MED ORDER — SODIUM CHLORIDE 0.9% FLUSH
3.0000 mL | Freq: Two times a day (BID) | INTRAVENOUS | Status: DC
  Administered 2020-02-13 – 2020-02-15 (×4): 3 mL via INTRAVENOUS

## 2020-02-13 MED ORDER — SODIUM CHLORIDE 0.9 % IV SOLN: 250.0000 mL | INTRAVENOUS | Status: DC | PRN

## 2020-02-13 MED ORDER — SODIUM CHLORIDE 0.9% FLUSH: 3.0000 mL | INTRAVENOUS | Status: DC | PRN

## 2020-02-13 MED ORDER — IOHEXOL 350 MG/ML SOLN
75.0000 mL | Freq: Once | INTRAVENOUS | Status: AC | PRN
  Administered 2020-02-13: 14:00:00 75 mL via INTRAVENOUS

## 2020-02-13 NOTE — Plan of Care (Signed)
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
## 2020-02-13 NOTE — ED Notes (Signed)
RN notified about vitals 
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
## 2020-02-13 NOTE — ED Notes (Signed)
Report given to inpt RN

## 2020-02-13 NOTE — ED Notes (Addendum)
Spoke with patient's PCP who advised that he was aware patient was still in [place], he wanted to let staff know that patient was sent for covid symptoms but, more concerning was bilateral pleural effusions that they saw on his xray. Pt has hx of pleural cyst and provider was concerned patient may need further work up for this. Results of cxray in chart.

## 2020-02-13 NOTE — ED Notes (Signed)
Increased RR-- pt placed on 2L [place] for comfort

## 2020-02-13 NOTE — ED Notes (Signed)
Pt transported to CT ?
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
## 2020-02-13 NOTE — ED Notes (Signed)
Dinner Tray Ordered @ 1657. 

## 2020-02-14 ENCOUNTER — Inpatient Hospital Stay (HOSPITAL_COMMUNITY): Payer: BC Managed Care – PPO

## 2020-02-14 ENCOUNTER — Encounter (HOSPITAL_COMMUNITY): Payer: Self-pay | Admitting: Internal Medicine

## 2020-02-14 ENCOUNTER — Observation Stay (HOSPITAL_COMMUNITY): Payer: BC Managed Care – PPO

## 2020-02-14 DIAGNOSIS — Z6822 Body mass index (BMI) 22.0-22.9, adult: Secondary | ICD-10-CM | POA: Diagnosis not present

## 2020-02-14 DIAGNOSIS — I3131 Malignant pericardial effusion in diseases classified elsewhere: Secondary | ICD-10-CM

## 2020-02-14 DIAGNOSIS — I471 Supraventricular tachycardia: Secondary | ICD-10-CM | POA: Diagnosis present

## 2020-02-14 DIAGNOSIS — Z806 Family history of leukemia: Secondary | ICD-10-CM | POA: Diagnosis not present

## 2020-02-14 DIAGNOSIS — R112 Nausea with vomiting, unspecified: Secondary | ICD-10-CM | POA: Diagnosis present

## 2020-02-14 DIAGNOSIS — I313 Pericardial effusion (noninflammatory): Secondary | ICD-10-CM | POA: Diagnosis present

## 2020-02-14 DIAGNOSIS — E875 Hyperkalemia: Secondary | ICD-10-CM | POA: Diagnosis not present

## 2020-02-14 DIAGNOSIS — C9150 Adult T-cell lymphoma/leukemia (HTLV-1-associated) not having achieved remission: Secondary | ICD-10-CM | POA: Diagnosis present

## 2020-02-14 DIAGNOSIS — Z8249 Family history of ischemic heart disease and other diseases of the circulatory system: Secondary | ICD-10-CM | POA: Diagnosis not present

## 2020-02-14 DIAGNOSIS — Z833 Family history of diabetes mellitus: Secondary | ICD-10-CM | POA: Diagnosis not present

## 2020-02-14 DIAGNOSIS — J9601 Acute respiratory failure with hypoxia: Secondary | ICD-10-CM

## 2020-02-14 DIAGNOSIS — D72829 Elevated white blood cell count, unspecified: Secondary | ICD-10-CM

## 2020-02-14 DIAGNOSIS — Z20822 Contact with and (suspected) exposure to covid-19: Secondary | ICD-10-CM | POA: Diagnosis present

## 2020-02-14 DIAGNOSIS — J9621 Acute and chronic respiratory failure with hypoxia: Secondary | ICD-10-CM | POA: Diagnosis not present

## 2020-02-14 DIAGNOSIS — E883 Tumor lysis syndrome: Secondary | ICD-10-CM | POA: Diagnosis present

## 2020-02-14 DIAGNOSIS — J90 Pleural effusion, not elsewhere classified: Secondary | ICD-10-CM

## 2020-02-14 DIAGNOSIS — C9100 Acute lymphoblastic leukemia not having achieved remission: Secondary | ICD-10-CM | POA: Diagnosis not present

## 2020-02-14 DIAGNOSIS — Z801 Family history of malignant neoplasm of trachea, bronchus and lung: Secondary | ICD-10-CM | POA: Diagnosis not present

## 2020-02-14 DIAGNOSIS — Z807 Family history of other malignant neoplasms of lymphoid, hematopoietic and related tissues: Secondary | ICD-10-CM | POA: Diagnosis not present

## 2020-02-14 DIAGNOSIS — C801 Malignant (primary) neoplasm, unspecified: Secondary | ICD-10-CM | POA: Diagnosis not present

## 2020-02-14 DIAGNOSIS — R634 Abnormal weight loss: Secondary | ICD-10-CM | POA: Diagnosis present

## 2020-02-14 DIAGNOSIS — J9859 Other diseases of mediastinum, not elsewhere classified: Secondary | ICD-10-CM

## 2020-02-14 HISTORY — PX: IR THORACENTESIS ASP PLEURAL SPACE W/IMG GUIDE: IMG5380

## 2020-02-14 LAB — ECHOCARDIOGRAM COMPLETE
Area-P 1/2: 3.74 cm2
Height: 76 in
Single Plane A4C EF: 42.9 %
Weight: 2998.26 oz

## 2020-02-14 LAB — BASIC METABOLIC PANEL
Anion gap: 11 (ref 5–15)
BUN: 19 mg/dL (ref 6–20)
CO2: 23 mmol/L (ref 22–32)
Calcium: 9.4 mg/dL (ref 8.9–10.3)
Chloride: 104 mmol/L (ref 98–111)
Creatinine, Ser: 1.19 mg/dL (ref 0.61–1.24)
GFR, Estimated: >60 mL/min (ref >60)
Glucose, Bld: 96 mg/dL (ref 70–99)
Potassium: 4.7 mmol/L (ref 3.5–5.1)
Sodium: 138 mmol/L (ref 135–145)

## 2020-02-14 LAB — CBC
HCT: 40.1 % (ref 39.0–52.0)
Hemoglobin: 13.5 g/dL (ref 13.0–17.0)
MCH: 28.7 pg (ref 26.0–34.0)
MCHC: 33.7 g/dL (ref 30.0–36.0)
MCV: 85.1 fL (ref 80.0–100.0)
Platelets: 178 10*3/uL (ref 150–400)
RBC: 4.71 MIL/uL (ref 4.22–5.81)
RDW: 13 % (ref 11.5–15.5)
WBC: 29.6 10*3/uL — ABNORMAL HIGH (ref 4.0–10.5)
nRBC: 0.1 % (ref 0.0–0.2)

## 2020-02-14 LAB — LACTATE DEHYDROGENASE, PLEURAL OR PERITONEAL FLUID: LD, Fluid: 630 U/L — ABNORMAL HIGH (ref 3–23)

## 2020-02-14 LAB — PROTEIN, PLEURAL OR PERITONEAL FLUID: Total protein, fluid: 5 g/dL

## 2020-02-14 LAB — ALBUMIN, PLEURAL OR PERITONEAL FLUID: Albumin, Fluid: 3.1 g/dL

## 2020-02-14 LAB — BODY FLUID CELL COUNT WITH DIFFERENTIAL: Total Nucleated Cell Count, Fluid: 99000 cu mm — ABNORMAL HIGH (ref 0–1000)

## 2020-02-14 LAB — MRSA PCR SCREENING: MRSA by PCR: NEGATIVE

## 2020-02-14 LAB — LACTIC ACID, PLASMA: Lactic Acid, Venous: 3.2 mmol/L (ref 0.5–1.9)

## 2020-02-14 LAB — GLUCOSE, PLEURAL OR PERITONEAL FLUID: Glucose, Fluid: <20 mg/dL

## 2020-02-14 LAB — AFP TUMOR MARKER: AFP, Serum, Tumor Marker: 4.1 ng/mL (ref 0.0–8.3)

## 2020-02-14 LAB — BETA HCG QUANT (REF LAB): hCG Quant: <1 m[IU]/mL (ref 0–3)

## 2020-02-14 LAB — GLUCOSE, CAPILLARY: Glucose-Capillary: 120 mg/dL — ABNORMAL HIGH (ref 70–99)

## 2020-02-14 LAB — PATHOLOGIST SMEAR REVIEW

## 2020-02-14 MED ORDER — CHLORHEXIDINE GLUCONATE CLOTH 2 % EX PADS
6.0000 | MEDICATED_PAD | Freq: Every day | CUTANEOUS | Status: DC
  Administered 2020-02-15 – 2020-02-16 (×2): 6 via TOPICAL

## 2020-02-14 MED ORDER — LIDOCAINE HCL 1 % IJ SOLN
INTRAMUSCULAR | Status: AC | PRN
  Administered 2020-02-14: 10 mL

## 2020-02-14 MED ORDER — DEXTROMETHORPHAN POLISTIREX ER 30 MG/5ML PO SUER
15.0000 mg | Freq: Two times a day (BID) | ORAL | Status: DC
  Administered 2020-02-14 – 2020-02-16 (×5): 15 mg via ORAL
  Filled 2020-02-14 (×5): qty 5

## 2020-02-14 MED ORDER — LIDOCAINE HCL 1 % IJ SOLN
INTRAMUSCULAR | Status: AC
  Filled 2020-02-14: qty 20

## 2020-02-14 MED ORDER — SODIUM CHLORIDE 0.9 % IV SOLN: INTRAVENOUS | Status: DC

## 2020-02-14 MED ORDER — BENZONATATE 100 MG PO CAPS
200.0000 mg | ORAL_CAPSULE | Freq: Three times a day (TID) | ORAL | Status: DC | PRN
  Administered 2020-02-14 – 2020-02-16 (×3): 200 mg via ORAL
  Filled 2020-02-14 (×3): qty 2

## 2020-02-14 MED ORDER — DEXAMETHASONE SODIUM PHOSPHATE 4 MG/ML IJ SOLN
4.0000 mg | Freq: Four times a day (QID) | INTRAMUSCULAR | Status: DC
  Administered 2020-02-14 – 2020-02-15 (×4): 4 mg via INTRAVENOUS
  Filled 2020-02-14 (×4): qty 1
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
## 2020-02-14 NOTE — Consult Note (Signed)
NAME:  Fernando Hickman, MRN:  [id number], DOB:  Nov 12, 1995, LOS: 0 ADMISSION DATE:  02/12/2020, CONSULTATION DATE:  02/14/20 REFERRING MD:  Marianna Payment - IMTS, CHIEF COMPLAINT:  SOB -- 12/15: tripod breathing  Brief History   24 yo with enlarging (very large) anterior mediastinal mass. More tenuous respiratory status.  History of present illness   24 yo M hx who presented to ED with 1 week of SOB dry cough, poor appetite, nightsweats, chills, chest tightness. 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 Significant progression since CT chest 08/2019 which was thought most likely to be benign lesion sugh as pericardial cyst. Admitted to IMTS 02/13/20, seen by Oncology in ED.  On 12/15/2, IR and CVTS consulted for biopsy (CT guided vs surgical biopsies respectively). Patient then developed more labored respiratory status, and PCCM consulted urgently to bedside.   Past Medical History  Childhood asthma  Significant Hospital Events   12/14 admitted for SOB, dry cough found to have very large mediastinal mass 12/15 IR and CVTS consulted for biopsy of mediastinal mass.  PCCM consulted for worsening respiratory status later in shift. Transferring to ICU on BiPAP.  Consults:  PCCM CVTS  IR  Procedures:  Thoracentesis 12/15 > 1.2 L hazy yellow fluid drained. Echocardiogram 12/15 > LVEF 60-65%. There is a moderate to large circumferential pericardial effusion  measuring 2.4cm at greatest diameter anteriorly over the RV free wall and  apex. The apical 4 chamber view is forshortened so not accurate assessment  of MV inflow. There is significant  RA diastolic collapse and intermittent RV diastolic collapse. The IVC is  not well visualized. Worrisome echo for at least pre tamponade.  Significant Diagnostic Tests:  12/14 CTA chest> Extremely large anterior mediastinal mass, extends into hemithorax R>L. Compression of airways. R pleural effusion trace L pleural effusion small pericardial effusion.  Personally reviewed.  Micro Data:  COVID neg   Antimicrobials:    Interim history/subjective:  Increasing respiratory effort Called to bedside, tripod position   Objective   Blood pressure (!) 134/100, pulse 70, temperature 98.4 F (36.9 C), resp. rate 18, height 6\' 4"  (1.93 m), weight 85 kg, SpO2 95 %.        Intake/Output Summary (Last 24 hours) at 02/14/2020 1800 Last data filed at 02/14/2020 1611 Gross per 24 hour  Intake 3 ml  Output --  Net 3 ml   Filed Weights   02/12/20 2238  Weight: 85 kg    Examination: General: Ill appearing [place] adult M tripod position in moderate distress HENT: NCAT trachea midline anicteric sclera Lungs: clear bilateral breath sounds, tachypnea, unable to speak full sentences  Cardiovascular: Tachy, regular. Distant heart sounds.  Abdomen: Non-distended Extremities: No acute deformity or edema.  Neuro: Alert, oriented, fatigued  Resolved Hospital Problem list     Assessment & Plan:   Acute hypoxemic respiratory failure: secondary to massive mediastinal mass compressing bilateral lungs. Very tenuous respiratory status.  - Extremely high risk intubation - Start BiPAP with plans to continue throughout the night.  - Should he fail BiPAP and require intubation, plan as discussed with ECMO providers would be to cannulate for VA prior to intubation in order to support circulatory system during induction.   Mediastinal mass: very large rapidly growing filling both left and right chest with multiple vessels encircled.  - CVTS following - Plan for IR biopsy 12/16 AM - Oncology following for biopsy result - start dexamethasone 4mg  q 6 hours for the time being.   Pericardial  effusion: with concerns for tamponade on Echocardiogram - Cardiology following - Not a candidate for perc drainage due to mass  Best practice (evaluated daily)   Diet: NPO Pain/Anxiety/Delirium protocol (if indicated): Consider Precedex infusion VAP protocol (if  indicated): NA DVT prophylaxis: Holding for now pending surgical needs GI prophylaxis: NA Glucose control: NA Mobility: Seat upright  last date of multidisciplinary goals of care discussion -- Family and staff present --  Summary of discussion -- Follow up goals of care discussion due 02/21/20 Code Status: Full Disposition: ICU  Labs   CBC: Recent Labs  Lab 02/13/20 1231 02/14/20 0602  WBC 28.4* 29.6*  NEUTROABS 23.3*  --   HGB 15.1 13.5  HCT 45.5 40.1  MCV 86.8 85.1  PLT 194 892    Basic Metabolic Panel: Recent Labs  Lab 02/13/20 1231 02/14/20 0602  NA 137 138  K 5.1 4.7  CL 100 104  CO2 23 23  GLUCOSE 84 96  BUN 18 19  CREATININE 1.20 1.19  CALCIUM 10.3 9.4   GFR: Estimated Creatinine Clearance: 116.1 mL/min (by C-G formula based on SCr of 1.19 mg/dL). Recent Labs  Lab 02/13/20 1231 02/14/20 0602  WBC 28.4* 29.6*    Liver Function Tests: Recent Labs  Lab 02/13/20 1231  AST 29  ALT 31  ALKPHOS 54  BILITOT 1.0  PROT 8.0  ALBUMIN 4.2   No results for input(s): LIPASE, AMYLASE in the last 168 hours. No results for input(s): AMMONIA in the last 168 hours.  ABG    Component Value Date/Time   TCO2 22 09/07/2019 1837     Coagulation Profile: No results for input(s): INR, PROTIME in the last 168 hours.  Cardiac Enzymes: No results for input(s): CKTOTAL, CKMB, CKMBINDEX, TROPONINI in the last 168 hours.  HbA1C: Hgb A1c MFr Bld  Date/Time Value Ref Range Status  10/08/2016 01:00 PM 4.9 4.8 - 5.6 % Final    Comment:             Pre-diabetes: 5.7 - 6.4          Diabetes: >6.4          Glycemic control for adults with diabetes: <7.0     CBG: Recent Labs  Lab 02/14/20 1735  GLUCAP 120*    Review of Systems:   Limited due to respiratory distress.   Past Medical History  He,  has a past medical history of Mediastinal mass.   Surgical History    Past Surgical History:  Procedure Laterality Date  . IR THORACENTESIS ASP PLEURAL  SPACE W/IMG GUIDE  02/14/2020     Social History   reports that he has never smoked. He has never used smokeless tobacco. He reports that he does not drink alcohol and does not use drugs.   Family History   His family history includes Dementia in his paternal grandmother; Diabetes in his maternal grandmother; Hypertension in his father and mother; Lung cancer in his maternal grandfather.   Allergies No Known Allergies   Home Medications  Prior to Admission medications   Medication Sig Start Date End Date Taking? Authorizing Provider  Multiple Vitamin (MULTIVITAMIN WITH MINERALS) TABS tablet Take 1 tablet by mouth daily.   Yes [provider]  cyclobenzaprine (FLEXERIL) 10 MG tablet Take 1 tablet (10 mg total) by mouth 2 (two) times daily as needed for muscle spasms. Patient not taking: Reported on 02/13/2020 09/07/19   Domenic Moras, PA-C  ibuprofen (ADVIL) 600 MG tablet Take 1 tablet (600  mg total) by mouth every 6 (six) hours as needed. Patient not taking: Reported on 02/13/2020 09/07/19   Domenic Moras, PA-C     Critical care time: 45 mins     Georgann Housekeeper, AGACNP-BC [place] for personal pager PCCM on call pager [phone]  02/14/2020 6:41 PM

## 2020-02-14 NOTE — Progress Notes (Signed)
ABG attempted x 1 and was unsuccessful. Patient requested to wait on a re stick. ABG canceled per Dr. Tamala Julian.
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
## 2020-02-14 NOTE — Progress Notes (Addendum)
.     Subjective:  ON: Patient required supplemental oxygen yesterday due to period of sustained tachypnea. Endorses good relief with this.  Patient seen lying in bed, with sister present at bedside. He reports stability of symptoms; at present, shortness of breath is at baseline for him. Further discussed anterior mediastinal mass with patient and diagnostic workup at this time.   Objective:  Vital signs in last 24 hours: Vitals:   02/13/20 1859 02/13/20 2100 02/14/20 0644 02/14/20 1321  BP:  128/82 113/61 116/84  Pulse:  99  86  Resp:  (!) 22 (!) 21 18  Temp: 98.3 F (36.8 C) 98.3 F (36.8 C) 98.6 F (37 C) 98.4 F (36.9 C)  TempSrc: Oral Oral Oral   SpO2:  95% 94% 97%  Weight:      Height:       Weight change:   Intake/Output Summary (Last 24 hours) at 02/14/2020 1450 Last data filed at 02/14/2020 0913 Gross per 24 hour  Intake 3 ml  Output --  Net 3 ml   General: Awake, alert, seen lying in bed. No acute distress. CV: Regular rate and rhythm Pulmonary: Apropriate effort. Diminished lung sounds prominent on right base Neuro: Alert, oriented x3. Appropriate mood and affect. Judgement intact.    Echo resulted:  -moderate pleural effusion -moderate to large circumferential pericardial effusion. -significant RA diastolic collapse and intermittent RV diastolic collapse. worrisome for pre-tamponade -inferior vena cava was not well visualized. The inferior vena cava is normal in size with greater than 50% respiratory variability. suggesting right atrial pressure of 3 mmHg.  Assessment/Plan:  Active Problems:   Mediastinal mass   Leukocytosis  #Mediastinal Mass Patient with large anterior mediastinal mass, notable leukocytosis and elevated LDH, and presence of blasts per blood smear. Further workup of likely malignant etiology. Patient is followed by Oncology.  -Cytometry (pending), and CT of the abdomen/pelvis biopsy (ordered) recommended by Oncology; appreciate  recs -while echo raising concern for possible pre-tamponade, patient currently clinically stable, with appropriate blood pressures. Will continue monitoring  #Leukocytosis  Patient with WBC of 28.4, Neutrophil count of 23.3, and Lymphocte of 4.3. Potential etiology of Bone marrow involvement from lymphoma or T-ALL -flow cytometry pending   #Pleural effusion Thoracentesis by IR today  #Pericardial effusion Initially suspected to be small per CT. Moderate-large effusion found on echo today. Cardiology consulted   LOS: 0 days   Azell Der, Medical Student 02/14/2020, 2:50 PM

## 2020-02-14 NOTE — Progress Notes (Signed)
Paged to patient's room regarding acute worsening of shortness of breath.  Patient sitting on the edge of the bed in tripod position.  Patient was tachypneic with respiratory distress making it difficult for him to speak.  He was diaphoretic with cool extremities.  There is no obvious face or neck swelling presents on exam.  Patient's lungs are positive for diffuse crackles on the right lung fields with decreased air movement.  Heart exam was negative for  murmurs rubs or gallops.  Chest x-ray was ordered which was negative for pneumothorax.  I spoke with pulmonary critical care team regarding this patient.  Due to his high risk for decompensation, they agreed to transfer him to the ICU and start BiPAP.  The PCCM team will coordinate care with CT surgery regarding biopsy for his possible debulking procedure in the near future.  [name], D.O.  Internal Medicine Resident, PGY-2 [facility]  Pager: [phone] 6:09 PM, 02/14/2020

## 2020-02-14 NOTE — Progress Notes (Signed)
Patient ID: Fernando Hickman, male   DOB: 03/28/1995, 24 y.o.   MRN: [id number] Pt has now been scheduled for CT guided mediastinal mass biopsy on 12/16 in addition to previously ordered BM bx. See consult note from earlier today for additional medical hx. Pt/sister updated on plans and details/risks of procedure d/w both with their understanding and consent.

## 2020-02-14 NOTE — Progress Notes (Signed)
Cheree Ditto NP notified of patients lactic acid result. Will trend, no intervention at this time.   Results for QUE, MENEELY (MRN [id number]) as of 02/14/2020 22:08  Ref. Range 02/14/2020 20:39  Lactic Acid, Venous Latest Ref Range: 0.5 - 1.9 mmol/L 3.2 (HH)

## 2020-02-14 NOTE — Progress Notes (Signed)
Upon patient returning from his thoracentesis, pt started experiencing coughing spells and worsening shortness of breath. This RN and CT surgery at bedside to assess patient. Primary team paged by CT surgery. This RN also sent a message to primary team. Orders received for tessalon pearls and patient placed on O2. Shortly after giving tessalon pearls, patient still very short of breath and is tripoding. Patient diaphoretic, lightheaded, and pale. Primary team paged and called to bedside STAT for evaluation. Vital signs and blood glucose obtained. Will continue to monitor.
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
## 2020-02-14 NOTE — Consult Note (Signed)
ECMO Consult Note   Called to 2H for ECMO Consult at (time)1900 by Fernando Quest, MD Admitting Diagnosis- Shortness of breath Primary Issue- Large anterior mediastinal mass. Age:24 y.o. Weight: 85kg BMI: 22.81  Days on Mechanical Ventilation- not on mechanical ventilation  MAP FiO2 Oxygen Index P/F Ratio  94 4lpm  N/A N/A    Vasopressors no   MSOF No   RESP score (VV-ECMO) : N/A http://www.respscore.com  SAVE score (VA-ECMO) : N/A http://www.save-score.com  Recent Blood Gas:     Component Value Date/Time   TCO2 22 09/07/2019 1837    Coags:    Component Value Date/Time   INR 1.1 09/07/2019 1800    CBC    Component Value Date/Time   WBC 29.6 (H) 02/14/2020 0602   RBC 4.71 02/14/2020 0602   HGB 13.5 02/14/2020 0602   HGB 13.8 03/18/2018 1155   HCT 40.1 02/14/2020 0602   HCT 41.3 03/18/2018 1155   PLT 178 02/14/2020 0602   PLT 236 03/18/2018 1155   MCV 85.1 02/14/2020 0602   MCV 89 03/18/2018 1155   MCH 28.7 02/14/2020 0602   MCHC 33.7 02/14/2020 0602   RDW 13.0 02/14/2020 0602   RDW 12.0 03/18/2018 1155   LYMPHSABS 4.3 (H) 02/13/2020 1231   LYMPHSABS 2.0 03/18/2018 1155   MONOABS 0.9 02/13/2020 1231   EOSABS 0.0 02/13/2020 1231   EOSABS 0.1 03/18/2018 1155   BASOSABS 0.0 02/13/2020 1231   BASOSABS 0.0 03/18/2018 1155    BMET    Component Value Date/Time   NA 138 02/14/2020 0602   NA 140 03/18/2018 1155   K 4.7 02/14/2020 0602   CL 104 02/14/2020 0602   CO2 23 02/14/2020 0602   GLUCOSE 96 02/14/2020 0602   BUN 19 02/14/2020 0602   BUN 11 03/18/2018 1155   CREATININE 1.19 02/14/2020 0602   CALCIUM 9.4 02/14/2020 0602   GFRNONAA >60 02/14/2020 0602   GFRAA >60 09/07/2019 1800                                                                                                                                                             ECMO physician Fernando Hickman notified at 1900 (time) Candidate meets ECMO Criteria- Yes  Placed on ECMO watch at 2000  (time).  Additional notes-  Reason for Consult: Periprocedural ECMO Referring Physician: D Fernando Hickman is an 24 y.o. male.  HPI: 24 year old man with rapidly progressive mediastinal mass with associated weight loss and malaise.   CT imaging personally reviewed and shows near complete filling of right hemithorax, partial IVC compression. Airways appear patent.  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   Patient was transferred to ICU for respiratory distress following thoracentesis.   Past Medical History:  Diagnosis Date  .  Mediastinal mass     Past Surgical History:  Procedure Laterality Date  . IR THORACENTESIS ASP PLEURAL SPACE W/IMG GUIDE  02/14/2020    Family History  Problem Relation Age of Onset  . Hypertension Mother   . Hypertension Father   . Diabetes Maternal Grandmother   . Lung cancer Maternal Grandfather   . Dementia Paternal Grandmother     Social History:  reports that he has never smoked. He has never used smokeless tobacco. He reports that he does not drink alcohol and does not use drugs.  Allergies: No Known Allergies  Medications: I have reviewed the patient's current medications.  Results for orders placed or performed during the hospital encounter of 02/12/20 (from the past 48 hour(s))  Resp Panel by RT-PCR (Flu A&B, Covid) Nasopharyngeal Swab     Status: None   Collection Time: 02/12/20 10:54 PM   Specimen: Nasopharyngeal Swab; Nasopharyngeal(NP) swabs in vial transport medium  Result Value Ref Range   SARS Coronavirus 2 by RT PCR NEGATIVE NEGATIVE    Comment: (NOTE) SARS-CoV-2 target nucleic acids are NOT DETECTED.  The SARS-CoV-2 RNA is generally detectable in upper respiratory specimens during the acute phase of infection. The lowest concentration of SARS-CoV-2 viral copies this assay can detect is 138 copies/mL. A negative result does not preclude SARS-Cov-2 infection and should not be  used as the sole basis for treatment or other patient management decisions. A negative result may occur with  improper specimen collection/handling, submission of specimen other than nasopharyngeal swab, presence of viral mutation(s) within the areas targeted by this assay, and inadequate number of viral copies(<138 copies/mL). A negative result must be combined with clinical observations, patient history, and epidemiological information. The expected result is Negative.  Fact Sheet for Patients:  EntrepreneurPulse.com.au  Fact Sheet for Healthcare Providers:  IncredibleEmployment.be  This test is no t yet approved or cleared by the [place] FDA and  has been authorized for detection and/or diagnosis of SARS-CoV-2 by FDA under an Emergency Use Authorization (EUA). This EUA will remain  in effect (meaning this test can be used) for the duration of the COVID-19 declaration under Section 564(b)(1) of the Act, 21 U.S.C.section 360bbb-3(b)(1), unless the authorization is terminated  or revoked sooner.       Influenza A by PCR NEGATIVE NEGATIVE   Influenza B by PCR NEGATIVE NEGATIVE    Comment: (NOTE) The Xpert Xpress SARS-CoV-2/FLU/RSV plus assay is intended as an aid in the diagnosis of influenza from Nasopharyngeal swab specimens and should not be used as a sole basis for treatment. Nasal washings and aspirates are unacceptable for Xpert Xpress SARS-CoV-2/FLU/RSV testing.  Fact Sheet for Patients: EntrepreneurPulse.com.au  Fact Sheet for Healthcare Providers: IncredibleEmployment.be  This test is not yet approved or cleared by the [place] FDA and has been authorized for detection and/or diagnosis of SARS-CoV-2 by FDA under an Emergency Use Authorization (EUA). This EUA will remain in effect (meaning this test can be used) for the duration of the COVID-19 declaration under Section 564(b)(1) of the  Act, 21 U.S.C. section 360bbb-3(b)(1), unless the authorization is terminated or revoked.  Performed at [facility], [place] [address] [place], [place] [address]   CBC with Differential     Status: Abnormal   Collection Time: 02/13/20 12:31 PM  Result Value Ref Range   WBC 28.4 (H) 4.0 - 10.5 K/uL   RBC 5.24 4.22 - 5.81 MIL/uL   Hemoglobin 15.1 13.0 - 17.0 g/dL   HCT 45.5  39.0 - 52.0 %   MCV 86.8 80.0 - 100.0 fL   MCH 28.8 26.0 - 34.0 pg   MCHC 33.2 30.0 - 36.0 g/dL   RDW 13.1 11.5 - 15.5 %   Platelets 194 150 - 400 K/uL   nRBC 0.1 0.0 - 0.2 %   Neutrophils Relative % 46 %   Neutro Abs 23.3 (H) 1.7 - 7.7 K/uL   Band Neutrophils 9 %   Lymphocytes Relative 15 %   Lymphs Abs 4.3 (H) 0.7 - 4.0 K/uL   Monocytes Relative 3 %   Monocytes Absolute 0.9 0.1 - 1.0 K/uL   Eosinophils Relative 0 %   Eosinophils Absolute 0.0 0.0 - 0.5 K/uL   Basophils Relative 0 %   Basophils Absolute 0.0 0.0 - 0.1 K/uL   nRBC 0 0 /100 WBC   Metamyelocytes Relative 2 %   Myelocytes 5 %   Promyelocytes Relative 0 %   Blasts 20 %    Comment: Performed at [facility] [address] [place], [place] [address]  Comprehensive metabolic panel     Status: None   Collection Time: 02/13/20 12:31 PM  Result Value Ref Range   Sodium 137 135 - 145 mmol/L   Potassium 5.1 3.5 - 5.1 mmol/L   Chloride 100 98 - 111 mmol/L   CO2 23 22 - 32 mmol/L   Glucose, Bld 84 70 - 99 mg/dL    Comment: Glucose reference range applies only to samples taken after fasting for at least 8 hours.   BUN 18 6 - 20 mg/dL   Creatinine, Ser 1.20 0.61 - 1.24 mg/dL   Calcium 10.3 8.9 - 10.3 mg/dL   Total Protein 8.0 6.5 - 8.1 g/dL   Albumin 4.2 3.5 - 5.0 g/dL   AST 29 15 - 41 U/L   ALT 31 0 - 44 U/L   Alkaline Phosphatase 54 38 - 126 U/L   Total Bilirubin 1.0 0.3 - 1.2 mg/dL   GFR, Estimated >60 >60 mL/min    Comment: (NOTE) Calculated using the CKD-EPI Creatinine Equation (2021)    Anion gap 14 5 - 15    Comment:  Performed at [facility] [address] [place], [place] [address]  TSH     Status: None   Collection Time: 02/13/20 12:31 PM  Result Value Ref Range   TSH 1.701 0.350 - 4.500 uIU/mL    Comment: Performed by a 3rd Generation assay with a functional sensitivity of <=0.01 uIU/mL. 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    Comment: Notified Dr. Elnora Morrison at [phone] 02/13/2020 by [employer]. Reviewed by Marlynn Perking. Melina Copa, M.Fernando. 02/13/2020. Performed at [facility], [place] [address], [place], [place] [address]   Sedimentation rate     Status: None   Collection Time: 02/13/20  3:56 PM  Result Value Ref Range   Sed Rate 12 0 - 16 mm/hr    Comment: Performed at [facility] [address], [place], [place] [address]  Lactate dehydrogenase     Status: Abnormal   Collection Time: 02/13/20  3:56 PM  Result Value Ref Range   LDH 323 (H) 98 - 192 U/L    Comment:  Performed at [facility], [place] [address], [place], [place] [address]  Hepatitis C antibody     Status: None   Collection Time: 02/13/20  3:56 PM  Result Value Ref Range   HCV Ab NON REACTIVE NON REACTIVE    Comment: (NOTE) Nonreactive HCV antibody screen is consistent with no HCV infections,  unless recent infection is suspected or other evidence exists to indicate HCV infection.  Performed at [facility], [place] [address], [place], [place] [address]   Hepatitis B surface antigen     Status: None   Collection Time: 02/13/20  3:56 PM  Result Value Ref Range   Hepatitis B Surface Ag NON REACTIVE NON REACTIVE    Comment: Performed at [facility] [address] [place], [place] [address]  Hepatitis B core antibody, total     Status: None   Collection Time: 02/13/20  3:56 PM   Result Value Ref Range   Hep B Core Total Ab NON REACTIVE NON REACTIVE    Comment: Performed at [facility] [address] [place], [place] [address]  HIV Antibody (routine testing w rflx)     Status: None   Collection Time: 02/13/20  3:56 PM  Result Value Ref Range   HIV Screen 4th Generation wRfx Non Reactive Non Reactive    Comment: Performed at [facility], [place] [address], [place], [place] [address]  Beta hCG quant (ref lab)     Status: None   Collection Time: 02/13/20  3:56 PM  Result Value Ref Range   hCG Quant <1 0 - 3 mIU/mL    Comment: (NOTE) Roche ECLIA methodology Performed At: [facility] [place], [place] [id number] Rush Farmer MD QP:[id number]   AFP tumor marker     Status: None   Collection Time: 02/13/20  3:56 PM  Result Value Ref Range   AFP, Serum, Tumor Marker 4.1 0.0 - 8.3 ng/mL    Comment: (NOTE) Roche Diagnostics Electrochemiluminescence Immunoassay (ECLIA) Values obtained with different assay methods or kits cannot be used interchangeably.  Results cannot be interpreted as absolute evidence of the presence or absence of malignant disease. This test is not interpretable in pregnant females. Performed At: [facility], [place], [place] [id number] Rush Farmer MD [id number]   Basic metabolic panel     Status: None   Collection Time: 02/14/20  6:02 AM  Result Value Ref Range   Sodium 138 135 - 145 mmol/L   Potassium 4.7 3.5 - 5.1 mmol/L   Chloride 104 98 - 111 mmol/L   CO2 23 22 - 32 mmol/L   Glucose, Bld 96 70 - 99 mg/dL    Comment: Glucose reference range applies only to samples taken after fasting for at least 8 hours.   BUN 19 6 - 20 mg/dL   Creatinine, Ser 1.19 0.61 - 1.24 mg/dL   Calcium 9.4 8.9 - 10.3 mg/dL   GFR, Estimated >60 >60 mL/min    Comment: (NOTE) Calculated using the CKD-EPI Creatinine Equation (2021)    Anion gap 11 5 - 15    Comment: Performed at [facility] [address], [place] [address]  CBC     Status: Abnormal   Collection Time: 02/14/20  6:02 AM  Result Value Ref Range   WBC 29.6 (H) 4.0 - 10.5 K/uL   RBC 4.71 4.22 - 5.81 MIL/uL   Hemoglobin 13.5 13.0 - 17.0  g/dL   HCT 40.1 39.0 - 52.0 %   MCV 85.1 80.0 - 100.0 fL   MCH 28.7 26.0 - 34.0 pg   MCHC 33.7 30.0 - 36.0 g/dL   RDW 13.0 11.5 - 15.5 %   Platelets 178 150 - 400 K/uL   nRBC 0.1 0.0 - 0.2 %    Comment: Performed at [facility], [place] [address] [place], [place] [address]  Lactate dehydrogenase (pleural or peritoneal fluid)     Status: Abnormal   Collection Time: 02/14/20  3:22 PM  Result Value Ref Range   LD, Fluid 630 (H) 3 - 23 U/L    Comment: (NOTE) Results should be evaluated in conjunction with serum values    Fluid Type-FLDH PLEURAL     Comment: RIGHT FLUID Performed at [facility] [address], [place], [place] [address] CORRECTED ON 12/15 AT 9381: PREVIOUSLY REPORTED AS Lung, Right   Body fluid cell count with differential     Status: Abnormal   Collection Time: 02/14/20  3:22 PM  Result Value Ref Range   Fluid Type-FCT PLEURAL     Comment: RIGHT FLUID CORRECTED ON 12/15 AT 1645: PREVIOUSLY REPORTED AS Lung, Right    Color, Fluid YELLOW (A) YELLOW   Appearance, Fluid CLOUDY (A) CLEAR   Total Nucleated Cell Count, Fluid 99,000 (H) 0 - 1,000 cu mm   Other Cells, Fluid POSSIBLE MONONUCLEAR CELLS SEEN %    Comment: Performed at [facility], [place] [address], [place], [place] [address]  Body fluid culture     Status: None (Preliminary result)   Collection Time: 02/14/20  3:22 PM   Specimen: Lung, Right; Pleural Fluid  Result Value Ref Range   Specimen Description PLEURAL FLUID    Special Requests RIGHT LUNG    Gram Stain      ABUNDANT WBC PRESENT,BOTH PMN AND MONONUCLEAR NO ORGANISMS SEEN Performed at [facility], [place] [address] [place], [place] [address]    Culture PENDING    Report Status PENDING    Albumin, pleural or peritoneal fluid     Status: None   Collection Time: 02/14/20  3:22 PM  Result Value Ref Range   Albumin, Fluid 3.1 g/dL    Comment: (NOTE) No normal range established for this test Results should be evaluated in conjunction with serum values    Fluid Type-FALB PLEURAL     Comment: RIGHT FLUID Performed at [facility] [address], [place], [place] [address] CORRECTED ON 12/15 AT 1017: PREVIOUSLY REPORTED AS Lung, Right   Protein, pleural or peritoneal fluid     Status: None   Collection Time: 02/14/20  3:22 PM  Result Value Ref Range   Total protein, fluid 5.0 g/dL    Comment: (NOTE) No normal range established for this test Results should be evaluated in conjunction with serum values    Fluid Type-FTP PLEURAL     Comment: RIGHT FLUID Performed at [facility] [address] [place], [place] [address] CORRECTED ON 12/15 AT 8527: PREVIOUSLY REPORTED AS Lung, Right   Glucose, pleural or peritoneal fluid     Status: None   Collection Time: 02/14/20  3:22 PM  Result Value Ref Range   Glucose, Fluid <20 mg/dL    Comment: REPEATED TO VERIFY CRITICAL RESULT CALLED TO, READ BACK BY AND VERIFIED WITH: Noreene Filbert 1751 02/14/2020 WBOND    Fluid Type-FGLU PLEURAL     Comment: RIGHT FLUID  Performed at [facility], [place] [address], [place],  [address] CORRECTED ON 12/15 AT 8295: PREVIOUSLY REPORTED AS Lung, Right   Glucose, capillary     Status: Abnormal   Collection Time: 02/14/20  5:35 PM  Result Value Ref Range   Glucose-Capillary 120 (H) 70 - 99 mg/dL    Comment: Glucose reference range applies only to samples taken after fasting for at least 8 hours.    DG Chest 1 View  Result Date: 02/14/2020 CLINICAL DATA:  Post right thoracentesis EXAM: CHEST  1 VIEW COMPARISON:  CT from the previous day FINDINGS: No pneumothorax. Large mass centered in the right hilum extending into the right lower chest and to the left hilum as  before. Heart size difficult to assess due to adjacent opacity. Cannot exclude small residual effusions. Visualized bones unremarkable. IMPRESSION: No pneumothorax post  right thoracentesis Electronically Signed   By: Lucrezia Europe M.Fernando.   On: 02/14/2020 16:57   CT ANGIO CHEST PE W OR WO CONTRAST  Result Date: 02/13/2020 CLINICAL DATA:  Chest congestion and dry cough for the past week. EXAM: CT ANGIOGRAPHY CHEST WITH CONTRAST TECHNIQUE: Multidetector CT imaging of the chest was performed using the standard protocol during bolus administration of intravenous contrast. Multiplanar CT image reconstructions and MIPs were obtained to evaluate the vascular anatomy. CONTRAST:  34mL OMNIPAQUE IOHEXOL 350 MG/ML SOLN COMPARISON:  MR chest dated September 21, 2019. CT chest dated September 07, 2019. FINDINGS: Cardiovascular: Satisfactory opacification of the pulmonary arteries to the segmental level. No evidence of pulmonary embolism. Narrowing of the right pulmonary artery due to mediastinal mass effect. Normal heart size, displaced to the left. New small pericardial effusion. No thoracic aortic aneurysm or dissection. 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 The mass abuts the right side of the heart and superior vena cava, which is narrowed distally. There is also narrowing of the right pulmonary artery and right mainstem bronchus. The mass extends superiorly to the base of the great vessels and also now appears to invade the right upper lobe (series 6, image 59). New enlarged right hilar lymph nodes measuring up to 1.4 cm in short axis. No enlarged mediastinal or axillary lymph nodes. The thyroid gland, trachea, and esophagus demonstrate no significant findings. Lungs/Pleura: New moderate right pleural effusion,  mostly subpulmonic. New trace left pleural effusion. New atelectasis in the right upper and lower lobes. No consolidation or pneumothorax. Upper Abdomen: No acute abnormality. Musculoskeletal: No chest wall abnormality. No acute or significant osseous findings. Review of the MIP images confirms the above findings. IMPRESSION: 1. No evidence of pulmonary embolism. 2. 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 Differential considerations include thymic neoplasm, lymphoma, or germ-cell tumor. 3. New moderate right and trace left pleural effusions. 4. New small pericardial effusion. Electronically Signed   By: Titus Dubin M.Fernando.   On: 02/13/2020 15:05   DG Chest Port 1 View  Result Date: 02/14/2020 CLINICAL DATA:  Worsening shortness of breath after thoracentesis EXAM: PORTABLE CHEST 1 VIEW COMPARISON:  02/14/2020, CT 02/13/2020 FINDINGS: Small bilateral pleural effusions, mildly decreased on the right side. Airspace disease at  both bases. Abnormal cardiomediastinal silhouette corresponding to large mediastinal mass on prior imaging. No pneumothorax is seen. IMPRESSION: 1. Decreased size of right pleural effusion without pneumothorax. Persistent small bilateral pleural effusions and bibasilar airspace disease. 2. Large mediastinal mass. Electronically Signed   By: Donavan Foil M.Fernando.   On: 02/14/2020 18:19   ECHOCARDIOGRAM COMPLETE  Result Date: 02/14/2020    ECHOCARDIOGRAM REPORT   Patient Name:   Fernando Hickman Date of Exam: 02/14/2020 Medical Rec #:  [id number]      Height:       76.0 in Accession #:    [id number]     Weight:       187.4 lb Date of Birth:  1995-09-21      BSA:          2.155 m Patient Age:    24 years       BP:           113/61 mmHg Patient Gender: M              HR:           90 bpm. Exam Location:   Inpatient Procedure: 2D Echo, Cardiac Doppler and Color Doppler Indications:    Pericardial effusion 423.9 / I31.3  History:        Patient has no prior history of Echocardiogram examinations.                 Risk Factors:Non-Smoker.  Sonographer:    Vickie Epley RDCS Referring Phys: [place]  1. Left ventricular ejection fraction, by estimation, is 60 to 65%. The left ventricle has normal function. The left ventricle has no regional wall motion abnormalities. Left ventricular diastolic function could not be evaluated.  2. Right ventricular systolic function is normal. The right ventricular size is normal. Tricuspid regurgitation signal is inadequate for assessing PA pressure.  3. There is a moderate to large circumferential pericardial effusion measuring 2.4cm at greatest diameter anteriorly over the RV free wall and apex. The apical 4 chamber view is forshortened so not accurate assessment of MV inflow. There is significant RA diastolic collapse and intermittent RV diastolic collapse. The IVC is not well visualized. Worrisome echo for at least pre tamponade. Clincal correlation recommended.  4. The mitral valve is normal in structure. No evidence of mitral valve regurgitation. No evidence of mitral stenosis.  5. The aortic valve is normal in structure. Aortic valve regurgitation is not visualized. No aortic stenosis is present.  6. Aortic dilatation noted. There is borderline dilatation of the aortic root, measuring 37 mm.  7. The inferior vena cava is normal in size with greater than 50% respiratory variability, suggesting right atrial pressure of 3 mmHg.  8. Findings discussed with Dr. Sherry Ruffing and recommend Cardiology consult. FINDINGS  Left Ventricle: Left ventricular ejection fraction, by estimation, is 60 to 65%. The left ventricle has normal function. The left ventricle has no regional wall motion abnormalities. The left ventricular internal cavity size was normal in size. There is   no left ventricular hypertrophy. Left ventricular diastolic function could not be evaluated. Right Ventricle: The right ventricular size is normal. No increase in right ventricular wall thickness. Right ventricular systolic function is normal. Tricuspid regurgitation signal is inadequate for assessing PA pressure. Left Atrium: Left atrial size was normal in size. Right Atrium: Right atrial size was normal in size. Pericardium: There is a moderate to large circumferential pericardial effusion measuring 2.4cm at greatest diameter anteriorly over  the RV free wall and apex. The apical 4 chamber view is forshortened so not accurate assessment of MV inflow. There is significant RA diastolic collapse and intermittent RV diastolic collapse. The IVC is not well visualized. Worrisome echo for at least pre tamponade. Clincal correlation recommended. Report was called to Dr. Loni Muse large pericardial effusion is present. The pericardial effusion is circumferential. There is diastolic collapse of the right atrial wall and diastolic collapse of the right ventricular free wall. Mitral Valve: The mitral valve is normal in structure. No evidence of mitral valve regurgitation. No evidence of mitral valve stenosis. Tricuspid Valve: The tricuspid valve is normal in structure. Tricuspid valve regurgitation is trivial. No evidence of tricuspid stenosis. Aortic Valve: The aortic valve is normal in structure. Aortic valve regurgitation is not visualized. No aortic stenosis is present. Pulmonic Valve: The pulmonic valve was normal in structure. Pulmonic valve regurgitation is trivial. No evidence of pulmonic stenosis. Aorta: Aortic dilatation noted. There is borderline dilatation of the aortic root, measuring 37 mm. Venous: The inferior vena cava was not well visualized. The inferior vena cava is normal in size with greater than 50% respiratory variability, suggesting right atrial pressure of 3 mmHg. IAS/Shunts: No atrial level shunt detected by  color flow Doppler. Additional Comments: There is a moderate pleural effusion in the left lateral region.  LEFT VENTRICLE PLAX 2D LVOT diam:     2.90 cm LV SV:         73 LV SV Index:   34 LVOT Area:     6.61 cm  LV Volumes (MOD) LV vol Fernando, MOD A4C: 161.0 ml LV vol s, MOD A4C: 92.0 ml LV SV MOD A4C:     161.0 ml RIGHT VENTRICLE TAPSE (M-mode): 1.2 cm LEFT ATRIUM           Index       RIGHT ATRIUM          Index LA Vol (A4C): 24.9 ml 11.56 ml/m RA Area:     6.84 cm                                   RA Volume:   8.85 ml  4.11 ml/m  AORTIC VALVE LVOT Vmax:   70.20 cm/s LVOT Vmean:  50.200 cm/s LVOT VTI:    0.111 m  AORTA Ao Root diam: 3.70 cm Ao Asc diam:  3.10 cm MITRAL VALVE               TRICUSPID VALVE MV Area (PHT): 3.74 cm    TR Peak grad:   12.4 mmHg MV Decel Time: 203 msec    TR Vmax:        176.00 cm/s MV E velocity: 73.30 cm/s MV A velocity: 80.60 cm/s  SHUNTS MV E/A ratio:  0.91        Systemic VTI:  0.11 m                            Systemic Diam: 2.90 cm Fransico Him MD Electronically signed by Fransico Him MD Signature Date/Time: 02/14/2020/1:33:44 PM    Final    IR THORACENTESIS ASP PLEURAL SPACE W/IMG GUIDE  Result Date: 02/14/2020 INDICATION: Patient with history of mediastinal mass, dyspnea, and right pleural effusion. Request made for diagnostic and therapeutic right thoracentesis. EXAM: ULTRASOUND GUIDED DIAGNOSTIC AND THERAPEUTIC RIGHT THORACENTESIS MEDICATIONS: 10 mL 1% lidocaine  COMPLICATIONS: None immediate. PROCEDURE: An ultrasound guided thoracentesis was thoroughly discussed with the patient and questions answered. The benefits, risks, alternatives and complications were also discussed. The patient understands and wishes to proceed with the procedure. Written consent was obtained. Ultrasound was performed to localize and mark an adequate pocket of fluid in the right chest. The area was then prepped and draped in the normal sterile fashion. 1% Lidocaine was used for local anesthesia.  Under ultrasound guidance a 6 Fr Safe-T-Centesis catheter was introduced. Thoracentesis was performed. The catheter was removed and a dressing applied. FINDINGS: A total of approximately 1.2 L of hazy gold fluid was removed. Samples were sent to the laboratory as requested by the clinical team. IMPRESSION: Successful ultrasound guided right thoracentesis yielding 1.2 L of pleural fluid. Read by: Earley Abide, PA-C Electronically Signed   By: Jacqulynn Cadet M.Fernando.   On: 02/14/2020 15:38    Review of Systems  All other systems reviewed and are negative.  Blood pressure 121/83, pulse (!) 108, temperature 98.4 F (36.9 C), temperature source Oral, resp. rate (!) 33, height 6\' 4"  (1.93 m), weight 85 kg, SpO2 95 %. Physical Exam Constitutional:      Appearance: He is normal weight. He is ill-appearing.  HENT:     Head: Normocephalic.     Nose: Nose normal.  Eyes:     Conjunctiva/sclera: Conjunctivae normal.     Pupils: Pupils are equal, round, and reactive to light.  Cardiovascular:     Rate and Rhythm: Tachycardia present.     Pulses: Normal pulses.  Pulmonary:     Effort: Pulmonary effort is normal.  Abdominal:     General: Abdomen is flat.  Musculoskeletal:     Cervical back: Normal range of motion.  Skin:    Capillary Refill: Capillary refill takes more than 3 seconds.  Neurological:     General: No focal deficit present.     Mental Status: He is alert.     Assessment/Plan:  Critically ill due to tenuous hemodynamics due to IVC compression cardiac shift and possible component of pericardiac constriction. Very little reserve to tolerate hypotension, supine positioning. Thus initiating pre-emptive ECMO would be difficult if not impossible to deploy safely.  Thus ECMO will remain on standby in case of clinical deterioration.   Furthermore, he is a Sales promotion account executive witness and I have confirmed with him that he would not accept blood transfusion even in a life-threatening situation. We  have discussed the situation as a team and we have agreed that he is not a candidate for ECMO.   CRITICAL CARE Performed by: Kipp Brood   Total critical care time: 60 minutes  Critical care time was exclusive of separately billable procedures and treating other patients.  Critical care was necessary to treat or prevent imminent or life-threatening deterioration.  Critical care was time spent personally by me on the following activities: development of treatment plan with patient and/or surrogate as well as nursing, discussions with consultants, evaluation of patient's response to treatment, examination of patient, obtaining history from patient or surrogate, ordering and performing treatments and interventions, ordering and review of laboratory studies, ordering and review of radiographic studies, pulse oximetry, re-evaluation of patient's condition and participation in multidisciplinary rounds.  Kipp Brood, MD [facility] ICU Physician [place]  Pager: [phone] Mobile: [phone] After hours: [phone].

## 2020-02-14 NOTE — Procedures (Signed)
PROCEDURE SUMMARY:  Successful image-guided right thoracentesis. Yielded 1.2 liters of hazy gold fluid. Patient tolerated procedure well. No immediate complications. EBL < 2 mL.  Specimen was sent for labs. CXR ordered.  Please see imaging section of Epic for full dictation.   Claris Pong Leman Martinek PA-C 02/14/2020 3:13 PM

## 2020-02-14 NOTE — Progress Notes (Signed)
Pt removed all monitoring equipment to ambulate to bathroom. Explained to patient the need to leave monitoring equipment in place, and the need to minimize mobility to decrease episodes of SOB. Monitoring equipment placed back on patient and patient assisted to bathroom.  Patient then returned to bed, no adverse events noted.

## 2020-02-14 NOTE — Progress Notes (Signed)
  Echocardiogram 2D Echocardiogram has been performed.  Geoffery Lyons Swaim 02/14/2020, 10:41 AM

## 2020-02-15 ENCOUNTER — Inpatient Hospital Stay (HOSPITAL_COMMUNITY): Payer: BC Managed Care – PPO

## 2020-02-15 ENCOUNTER — Other Ambulatory Visit: Payer: Self-pay | Admitting: *Deleted

## 2020-02-15 DIAGNOSIS — C801 Malignant (primary) neoplasm, unspecified: Secondary | ICD-10-CM

## 2020-02-15 DIAGNOSIS — J9859 Other diseases of mediastinum, not elsewhere classified: Secondary | ICD-10-CM

## 2020-02-15 DIAGNOSIS — I313 Pericardial effusion (noninflammatory): Secondary | ICD-10-CM

## 2020-02-15 DIAGNOSIS — J90 Pleural effusion, not elsewhere classified: Secondary | ICD-10-CM

## 2020-02-15 DIAGNOSIS — E883 Tumor lysis syndrome: Secondary | ICD-10-CM

## 2020-02-15 LAB — FLOW CYTOMETRY REQUEST - FLUID (INPATIENT)

## 2020-02-15 LAB — CBC WITH DIFFERENTIAL/PLATELET
Abs Immature Granulocytes: 0.6 10*3/uL — ABNORMAL HIGH (ref 0.00–0.07)
Basophils Absolute: 0 10*3/uL (ref 0.0–0.1)
Basophils Relative: 0 %
Blasts: 32 %
Eosinophils Absolute: 0.3 10*3/uL (ref 0.0–0.5)
Eosinophils Relative: 1 %
HCT: 46.1 % (ref 39.0–52.0)
Hemoglobin: 15.8 g/dL (ref 13.0–17.0)
Lymphocytes Relative: 12 %
Lymphs Abs: 3.6 10*3/uL (ref 0.7–4.0)
MCH: 29.1 pg (ref 26.0–34.0)
MCHC: 34.3 g/dL (ref 30.0–36.0)
MCV: 84.9 fL (ref 80.0–100.0)
Monocytes Absolute: 0.9 10*3/uL (ref 0.1–1.0)
Monocytes Relative: 3 %
Myelocytes: 2 %
Neutro Abs: 15 10*3/uL — ABNORMAL HIGH (ref 1.7–7.7)
Neutrophils Relative %: 50 %
Platelets: 189 10*3/uL (ref 150–400)
RBC: 5.43 MIL/uL (ref 4.22–5.81)
RDW: 13.1 % (ref 11.5–15.5)
WBC: 30 10*3/uL — ABNORMAL HIGH (ref 4.0–10.5)
nRBC: 0 /100 WBC
nRBC: 0.1 % (ref 0.0–0.2)

## 2020-02-15 LAB — COMPREHENSIVE METABOLIC PANEL
ALT: 27 U/L (ref 0–44)
AST: 29 U/L (ref 15–41)
Albumin: 3.8 g/dL (ref 3.5–5.0)
Alkaline Phosphatase: 49 U/L (ref 38–126)
Anion gap: 12 (ref 5–15)
BUN: 24 mg/dL — ABNORMAL HIGH (ref 6–20)
CO2: 18 mmol/L — ABNORMAL LOW (ref 22–32)
Calcium: 8.9 mg/dL (ref 8.9–10.3)
Chloride: 103 mmol/L (ref 98–111)
Creatinine, Ser: 1.13 mg/dL (ref 0.61–1.24)
GFR, Estimated: >60 mL/min (ref >60)
Glucose, Bld: 144 mg/dL — ABNORMAL HIGH (ref 70–99)
Potassium: 6.6 mmol/L (ref 3.5–5.1)
Sodium: 133 mmol/L — ABNORMAL LOW (ref 135–145)
Total Bilirubin: 1 mg/dL (ref 0.3–1.2)
Total Protein: 7.5 g/dL (ref 6.5–8.1)

## 2020-02-15 LAB — PHOSPHORUS: Phosphorus: 5.7 mg/dL — ABNORMAL HIGH (ref 2.5–4.6)

## 2020-02-15 LAB — BASIC METABOLIC PANEL
Anion gap: 11 (ref 5–15)
BUN: 24 mg/dL — ABNORMAL HIGH (ref 6–20)
CO2: 20 mmol/L — ABNORMAL LOW (ref 22–32)
Calcium: 9.1 mg/dL (ref 8.9–10.3)
Chloride: 104 mmol/L (ref 98–111)
Creatinine, Ser: 1.16 mg/dL (ref 0.61–1.24)
GFR, Estimated: >60 mL/min (ref >60)
Glucose, Bld: 149 mg/dL — ABNORMAL HIGH (ref 70–99)
Potassium: 5.8 mmol/L — ABNORMAL HIGH (ref 3.5–5.1)
Sodium: 135 mmol/L (ref 135–145)

## 2020-02-15 LAB — LACTATE DEHYDROGENASE: LDH: 382 U/L — ABNORMAL HIGH (ref 98–192)

## 2020-02-15 LAB — URIC ACID: Uric Acid, Serum: 14.8 mg/dL — ABNORMAL HIGH (ref 3.7–8.6)

## 2020-02-15 LAB — PH, BODY FLUID: pH, Body Fluid: 7.2

## 2020-02-15 LAB — POTASSIUM: Potassium: 4.9 mmol/L (ref 3.5–5.1)

## 2020-02-15 LAB — MAGNESIUM: Magnesium: 2.4 mg/dL (ref 1.7–2.4)

## 2020-02-15 LAB — SURGICAL PATHOLOGY

## 2020-02-15 MED ORDER — SODIUM CHLORIDE 0.9 % IV SOLN
20.0000 mg | Freq: Every day | INTRAVENOUS | Status: DC
  Administered 2020-02-15 – 2020-02-16 (×2): 20 mg via INTRAVENOUS
  Filled 2020-02-15 (×3): qty 2

## 2020-02-15 MED ORDER — ENOXAPARIN SODIUM 40 MG/0.4ML ~~LOC~~ SOLN
40.0000 mg | SUBCUTANEOUS | Status: DC
  Administered 2020-02-15: 18:00:00 40 mg via SUBCUTANEOUS
  Filled 2020-02-15: qty 0.4

## 2020-02-15 MED ORDER — IOHEXOL 300 MG/ML  SOLN
100.0000 mL | Freq: Once | INTRAMUSCULAR | Status: AC | PRN
  Administered 2020-02-15: 100 mL via INTRAVENOUS

## 2020-02-15 MED ORDER — MIDAZOLAM HCL 2 MG/2ML IJ SOLN
INTRAMUSCULAR | Status: AC
  Filled 2020-02-15: qty 2

## 2020-02-15 MED ORDER — LANTHANUM CARBONATE 500 MG PO CHEW
500.0000 mg | CHEWABLE_TABLET | Freq: Three times a day (TID) | ORAL | Status: DC
  Administered 2020-02-15 – 2020-02-16 (×3): 500 mg via ORAL
  Filled 2020-02-15 (×5): qty 1

## 2020-02-15 MED ORDER — ALUMINUM HYDROXIDE GEL 320 MG/5ML PO SUSP
30.0000 mL | Freq: Once | ORAL | Status: AC
  Administered 2020-02-15: 18:00:00 30 mL via ORAL
  Filled 2020-02-15: qty 30

## 2020-02-15 MED ORDER — LIDOCAINE HCL 1 % IJ SOLN
INTRAMUSCULAR | Status: AC
  Filled 2020-02-15: qty 20

## 2020-02-15 MED ORDER — SODIUM CHLORIDE 0.9 % IV SOLN
6.0000 mg | Freq: Once | INTRAVENOUS | Status: AC
  Administered 2020-02-15: 18:00:00 6 mg via INTRAVENOUS
  Filled 2020-02-15: qty 4

## 2020-02-15 MED ORDER — MIDAZOLAM HCL 2 MG/2ML IJ SOLN
INTRAMUSCULAR | Status: AC | PRN
  Administered 2020-02-15: 1 mg via INTRAVENOUS

## 2020-02-15 MED ORDER — SODIUM ZIRCONIUM CYCLOSILICATE 10 G PO PACK
10.0000 g | PACK | Freq: Once | ORAL | Status: AC
  Administered 2020-02-15: 10:00:00 10 g via ORAL
  Filled 2020-02-15: qty 1

## 2020-02-15 MED ORDER — ALLOPURINOL 300 MG PO TABS
150.0000 mg | ORAL_TABLET | Freq: Two times a day (BID) | ORAL | Status: DC
  Administered 2020-02-15 – 2020-02-16 (×2): 150 mg via ORAL
  Filled 2020-02-15 (×3): qty 1

## 2020-02-15 MED ORDER — FENTANYL CITRATE (PF) 100 MCG/2ML IJ SOLN
INTRAMUSCULAR | Status: AC
  Filled 2020-02-15: qty 2

## 2020-02-15 NOTE — Progress Notes (Signed)
The proposed treatment discussed in cancer conference 02/15/20 is for discussion purpose only and is not a binding recommendation.  The patient was not physically examined nor present for their treatment options.  Therefore, final treatment plans cannot be decided.  

## 2020-02-15 NOTE — Progress Notes (Signed)
NAME:  Fernando Hickman, MRN:  [id number], DOB:  06-25-1995, LOS: 1 ADMISSION DATE:  02/12/2020, CONSULTATION DATE:  02/14/20 REFERRING MD:  Marianna Payment - IMTS, CHIEF COMPLAINT:  SOB -- 12/15: tripod breathing  Brief History   24 yo with enlarging (very large) anterior mediastinal mass. More tenuous respiratory status after thoracentesis  History of present illness   24 yo M hx who presented to ED with 1 week of SOB dry cough, poor appetite, nightsweats, chills, chest tightness. 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 Significant progression since CT chest 08/2019 which was thought most likely to be benign lesion sugh as pericardial cyst. Admitted to IMTS 02/13/20, seen by Oncology in ED.  On 12/15/2, IR and CVTS consulted for biopsy (CT guided vs surgical biopsies respectively). Patient then developed more labored respiratory status, and PCCM consulted urgently to bedside.   Past Medical History  Childhood asthma Is Jehovah witness  [facility] Events   12/14 admitted for SOB, dry cough found to have very large mediastinal mass 12/15 IR and CVTS consulted for biopsy of mediastinal mass.  PCCM consulted for worsening respiratory status later in shift. Transferring to ICU on BiPAP.   Consults:  PCCM CVTS  IR  Procedures:  Thoracentesis 12/15 > 1.2 L hazy yellow fluid drained. Echocardiogram 12/15 > LVEF 60-65%. There is a moderate to large circumferential pericardial effusion  measuring 2.4cm at greatest diameter anteriorly over the RV free wall and  apex. The apical 4 chamber view is forshortened so not accurate assessment  of MV inflow. There is significant  RA diastolic collapse and intermittent RV diastolic collapse. The IVC is  not well visualized. Worrisome echo for at least pre tamponade.  Significant Diagnostic Tests:  12/14 CTA chest> Extremely large anterior mediastinal mass, extends into hemithorax R>L. Compression of airways. R pleural effusion trace L pleural  effusion small pericardial effusion. Personally reviewed.  Micro Data:  12/13 COVID neg  12/15 MRSA  Neg 12/15 right pleural fluid >>   Antimicrobials:  n/a  Interim history/subjective:  No complaints of pain or SOB, feeling better, slept poorly.  Did not need BiPAP overnight.  Overall, remains hemodynamically stable, afebrile  Objective   Blood pressure 120/78, pulse 71, temperature 99 F (37.2 C), temperature source Oral, resp. rate (!) 22, height 6\' 4"  (1.93 m), weight 85 kg, SpO2 100 %.    Vent Mode: PCV;BIPAP FiO2 (%):  [60 %] 60 % Set Rate:  [12 bmp] 12 bmp PEEP:  [7 cmH20] 7 cmH20   Intake/Output Summary (Last 24 hours) at 02/15/2020 0941 Last data filed at 02/15/2020 0800 Gross per 24 hour  Intake 1034.48 ml  Output 350 ml  Net 684.48 ml   Filed Weights   02/12/20 2238  Weight: 85 kg   Examination: General:  Winkel adult male sitting upright in bed, appears fatigued HEENT: MM pink/moist Neuro: AOx 4, MAE CV: rr, ir, NSR with PACs, distant heart sounds PULM:  mildly tachypneic, diminished anterior breath sounds, clear in bases GI: soft, bs active, foley Extremities: warm/dry, no LE edema  Skin: no rashes   Resolved Hospital Problem list     Assessment & Plan:   Acute hypoxemic respiratory failure: secondary to massive mediastinal mass compressing bilateral lungs, pleural effusion. Very tenuous respiratory status.  - Extremely high risk intubation - BiPAP prn - continue cough suppression with tessalon and delysm - not a candidate for ECMO  - supplemental O2 prn   Mediastinal mass: very large rapidly growing filling  both left and right chest with multiple vessels encircled including compression of airways, SVC and encroaching RA - CVTS/ Oncology following - following pleural cytology, exudative by Lights criteria  - Plan for IR biopsy 12/16 today - continue dexamethasone 4mg  q 6 hours for the time being - checking uric acid given slight hyperkalemia  -  continue NS 75 ml/hr   Pericardial effusion: with concerns for tamponade on Echocardiogram - Cardiology following - no signs of tamponade, continue to monitor - Not a candidate for perc drainage due to mass; would require surgical window  Hyperkalemia - checking uric acid now and daily for concern of tumor lysis syndrome - lokelmia now and recheck BMET at [facility] and then [facility] for possible transfer to tertiary center given complexity of mediastinal mass and high risk for cardiovascular complications.  Unfortunately, both [facility] and [facility] have no ICU beds and no waiting list.  Patient at this time does not want to proceed with trying [facility] for transfer and wishes to proceed with biopsy here.  IR contacted with update to proceed today. Both parents remain at bedside and updated.   Best practice (evaluated daily)   Diet: NPO for procedure Pain/Anxiety/Delirium protocol (if indicated): n/a VAP protocol (if indicated): NA DVT prophylaxis: Holding for now pending surgical needs GI prophylaxis: NA Glucose control: NA Mobility: Seat upright  last date of multidisciplinary goals of care discussion -- Family and staff present --  Summary of discussion -- Follow up goals of care discussion due 02/21/20 Code Status: Full Disposition: ICU  Labs   CBC: Recent Labs  Lab 02/13/20 1231 02/14/20 0602 02/15/20 0627  WBC 28.4* 29.6* 30.0*  NEUTROABS 23.3*  --  PENDING  HGB 15.1 13.5 15.8  HCT 45.5 40.1 46.1  MCV 86.8 85.1 84.9  PLT 194 178 540    Basic Metabolic Panel: Recent Labs  Lab 02/13/20 1231 02/14/20 0602 02/15/20 0627 02/15/20 0754  NA 137 138 133* 135  K 5.1 4.7 6.6* 5.8*  CL 100 104 103 104  CO2 23 23 18* 20*  GLUCOSE 84 96 144* 149*  BUN 18 19 24* 24*  CREATININE 1.20 1.19 1.13 1.16  CALCIUM 10.3 9.4 8.9 9.1   GFR: Estimated Creatinine Clearance: 119.1 mL/min (by C-G formula based on SCr of 1.16 mg/dL). Recent Labs  Lab 02/13/20 1231 02/14/20 0602  02/14/20 2039 02/15/20 0627  WBC 28.4* 29.6*  --  30.0*  LATICACIDVEN  --   --  3.2*  --     Liver Function Tests: Recent Labs  Lab 02/13/20 1231 02/15/20 0627  AST 29 29  ALT 31 27  ALKPHOS 54 49  BILITOT 1.0 1.0  PROT 8.0 7.5  ALBUMIN 4.2 3.8   No results for input(s): LIPASE, AMYLASE in the last 168 hours. No results for input(s): AMMONIA in the last 168 hours.  ABG    Component Value Date/Time   TCO2 22 09/07/2019 1837     Coagulation Profile: No results for input(s): INR, PROTIME in the last 168 hours.  Cardiac Enzymes: No results for input(s): CKTOTAL, CKMB, CKMBINDEX, TROPONINI in the last 168 hours.  HbA1C: Hgb A1c MFr Bld  Date/Time Value Ref Range Status  10/08/2016 01:00 PM 4.9 4.8 - 5.6 % Final    Comment:             Pre-diabetes: 5.7 - 6.4          Diabetes: >6.4  Glycemic control for adults with diabetes: <7.0     CBG: Recent Labs  Lab 02/14/20 1735  GLUCAP 120*         Kennieth Rad, ACNP [facility] 02/15/2020, 9:42 AM

## 2020-02-15 NOTE — Progress Notes (Signed)
Progress Note  Patient Name: Fernando Hickman Date of Encounter: 02/15/2020  [facility] Cardiologist: No primary care provider on file. New  Subjective   Feels better now. Not coughing. Reports SOB improved. In no distress  Inpatient Medications    Scheduled Meds: . Chlorhexidine Gluconate Cloth  6 each Topical Daily  . dexamethasone (DECADRON) injection  4 mg Intravenous Q6H  . dextromethorphan  15 mg Oral BID  . sodium chloride flush  3 mL Intravenous Q12H   Continuous Infusions: . sodium chloride    . sodium chloride 75 mL/hr at 02/15/20 0545   PRN Meds: sodium chloride, benzonatate, lidocaine, sodium chloride flush   Vital Signs    Vitals:   02/15/20 0545 02/15/20 0600 02/15/20 0615 02/15/20 0700  BP: 124/68 (!) 146/127 110/76 128/79  Pulse: 65 (!) 51 (!) 54 81  Resp: (!) 21 (!) 21 (!) 22 (!) 26  Temp:      TempSrc:      SpO2: 100% 100% 100% (!) 75%  Weight:      Height:        Intake/Output Summary (Last 24 hours) at 02/15/2020 0736 Last data filed at 02/15/2020 0600 Gross per 24 hour  Intake 1037.48 ml  Output 350 ml  Net 687.48 ml   Last 3 Weights 02/12/2020 09/07/2019 03/18/2018  Weight (lbs) 187 lb 6.3 oz 185 lb 180 lb 3.2 oz  Weight (kg) 85 kg 83.915 kg 81.738 kg      Telemetry    NSR with runs of ectopic atrial tachycardia - Personally Reviewed  ECG    Pending - Personally Reviewed  Physical Exam   GEN: Thin BM in no acute distress.   Neck: No JVD, jugular vein is completely flat Cardiac: RRR, no murmurs, rubs, or gallops.  Respiratory: Clear to auscultation bilaterally. GI: Soft, nontender, non-distended  MS: No edema; No deformity. Neuro:  Nonfocal  Psych: Normal affect   Labs    High Sensitivity Troponin:  No results for input(s): TROPONINIHS in the last 720 hours.    Chemistry Recent Labs  Lab 02/13/20 1231 02/14/20 0602 02/15/20 0627  NA 137 138 133*  K 5.1 4.7 6.6*  CL 100 104 103  CO2 23 23 18*  GLUCOSE 84 96  144*  BUN 18 19 24*  CREATININE 1.20 1.19 1.13  CALCIUM 10.3 9.4 8.9  PROT 8.0  --  7.5  ALBUMIN 4.2  --  3.8  AST 29  --  29  ALT 31  --  27  ALKPHOS 54  --  49  BILITOT 1.0  --  1.0  GFRNONAA >60 >60 >60  ANIONGAP $RemoveB'14 11 12     'xNMXrDsN$ Hematology Recent Labs  Lab 02/13/20 1231 02/14/20 0602  WBC 28.4* 29.6*  RBC 5.24 4.71  HGB 15.1 13.5  HCT 45.5 40.1  MCV 86.8 85.1  MCH 28.8 28.7  MCHC 33.2 33.7  RDW 13.1 13.0  PLT 194 178    BNPNo results for input(s): BNP, PROBNP in the last 168 hours.   DDimer No results for input(s): DDIMER in the last 168 hours.   Radiology    DG Chest 1 View  Result Date: 02/14/2020 CLINICAL DATA:  Post right thoracentesis EXAM: CHEST  1 VIEW COMPARISON:  CT from the previous day FINDINGS: No pneumothorax. Large mass centered in the right hilum extending into the right lower chest and to the left hilum as before. Heart size difficult to assess due to adjacent opacity. Cannot exclude small  residual effusions. Visualized bones unremarkable. IMPRESSION: No pneumothorax post  right thoracentesis Electronically Signed   By: Lucrezia Europe M.D.   On: 02/14/2020 16:57   CT ANGIO CHEST PE W OR WO CONTRAST  Result Date: 02/13/2020 CLINICAL DATA:  Chest congestion and dry cough for the past week. EXAM: CT ANGIOGRAPHY CHEST WITH CONTRAST TECHNIQUE: Multidetector CT imaging of the chest was performed using the standard protocol during bolus administration of intravenous contrast. Multiplanar CT image reconstructions and MIPs were obtained to evaluate the vascular anatomy. CONTRAST:  28mL OMNIPAQUE IOHEXOL 350 MG/ML SOLN COMPARISON:  MR chest dated September 21, 2019. CT chest dated September 07, 2019. FINDINGS: Cardiovascular: Satisfactory opacification of the pulmonary arteries to the segmental level. No evidence of pulmonary embolism. Narrowing of the right pulmonary artery due to mediastinal mass effect. Normal heart size, displaced to the left. New small pericardial effusion. No  thoracic aortic aneurysm or dissection. 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 The mass abuts the right side of the heart and superior vena cava, which is narrowed distally. There is also narrowing of the right pulmonary artery and right mainstem bronchus. The mass extends superiorly to the base of the great vessels and also now appears to invade the right upper lobe (series 6, image 59). New enlarged right hilar lymph nodes measuring up to 1.4 cm in short axis. No enlarged mediastinal or axillary lymph nodes. The thyroid gland, trachea, and esophagus demonstrate no significant findings. Lungs/Pleura: New moderate right pleural effusion, mostly subpulmonic. New trace left pleural effusion. New atelectasis in the right upper and lower lobes. No consolidation or pneumothorax. Upper Abdomen: No acute abnormality. Musculoskeletal: No chest wall abnormality. No acute or significant osseous findings. Review of the MIP images confirms the above findings. IMPRESSION: 1. No evidence of pulmonary embolism. 2. 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 Differential considerations include thymic neoplasm, lymphoma, or germ-cell tumor. 3. New moderate right and trace left pleural effusions. 4. New small pericardial effusion. Electronically Signed   By: Titus Dubin M.D.   On: 02/13/2020 15:05   DG Chest Port 1 View  Result Date: 02/14/2020 CLINICAL DATA:  Worsening shortness of breath after thoracentesis EXAM: PORTABLE CHEST 1 VIEW COMPARISON:  02/14/2020, CT 02/13/2020 FINDINGS: Small bilateral pleural effusions,  mildly decreased on the right side. Airspace disease at both bases. Abnormal cardiomediastinal silhouette corresponding to large mediastinal mass on prior imaging. No pneumothorax is seen. IMPRESSION: 1. Decreased size of right pleural effusion without pneumothorax. Persistent small bilateral pleural effusions and bibasilar airspace disease. 2. Large mediastinal mass. Electronically Signed   By: Donavan Foil M.D.   On: 02/14/2020 18:19   ECHOCARDIOGRAM COMPLETE  Result Date: 02/14/2020    ECHOCARDIOGRAM REPORT   Patient Name:   ALCIDE MEMOLI Tomkinson Date of Exam: 02/14/2020 Medical Rec #:  [id number]      Height:       76.0 in Accession #:    [id number]     Weight:       187.4 lb Date of Birth:  05/25/1995      BSA:  2.155 m Patient Age:    24 years       BP:           113/61 mmHg Patient Gender: M              HR:           90 bpm. Exam Location:  Inpatient Procedure: 2D Echo, Cardiac Doppler and Color Doppler Indications:    Pericardial effusion 423.9 / I31.3  History:        Patient has no prior history of Echocardiogram examinations.                 Risk Factors:Non-Smoker.  Sonographer:    Vickie Epley RDCS Referring Phys: [place]  1. Left ventricular ejection fraction, by estimation, is 60 to 65%. The left ventricle has normal function. The left ventricle has no regional wall motion abnormalities. Left ventricular diastolic function could not be evaluated.  2. Right ventricular systolic function is normal. The right ventricular size is normal. Tricuspid regurgitation signal is inadequate for assessing PA pressure.  3. There is a moderate to large circumferential pericardial effusion measuring 2.4cm at greatest diameter anteriorly over the RV free wall and apex. The apical 4 chamber view is forshortened so not accurate assessment of MV inflow. There is significant RA diastolic collapse and intermittent RV diastolic collapse. The IVC is not well visualized. Worrisome echo for at  least pre tamponade. Clincal correlation recommended.  4. The mitral valve is normal in structure. No evidence of mitral valve regurgitation. No evidence of mitral stenosis.  5. The aortic valve is normal in structure. Aortic valve regurgitation is not visualized. No aortic stenosis is present.  6. Aortic dilatation noted. There is borderline dilatation of the aortic root, measuring 37 mm.  7. The inferior vena cava is normal in size with greater than 50% respiratory variability, suggesting right atrial pressure of 3 mmHg.  8. Findings discussed with Dr. Sherry Ruffing and recommend Cardiology consult. FINDINGS  Left Ventricle: Left ventricular ejection fraction, by estimation, is 60 to 65%. The left ventricle has normal function. The left ventricle has no regional wall motion abnormalities. The left ventricular internal cavity size was normal in size. There is  no left ventricular hypertrophy. Left ventricular diastolic function could not be evaluated. Right Ventricle: The right ventricular size is normal. No increase in right ventricular wall thickness. Right ventricular systolic function is normal. Tricuspid regurgitation signal is inadequate for assessing PA pressure. Left Atrium: Left atrial size was normal in size. Right Atrium: Right atrial size was normal in size. Pericardium: There is a moderate to large circumferential pericardial effusion measuring 2.4cm at greatest diameter anteriorly over the RV free wall and apex. The apical 4 chamber view is forshortened so not accurate assessment of MV inflow. There is significant RA diastolic collapse and intermittent RV diastolic collapse. The IVC is not well visualized. Worrisome echo for at least pre tamponade. Clincal correlation recommended. Report was called to Dr. Loni Muse large pericardial effusion is present. The pericardial effusion is circumferential. There is diastolic collapse of the right atrial wall and diastolic collapse of the right ventricular free wall. Mitral  Valve: The mitral valve is normal in structure. No evidence of mitral valve regurgitation. No evidence of mitral valve stenosis. Tricuspid Valve: The tricuspid valve is normal in structure. Tricuspid valve regurgitation is trivial. No evidence of tricuspid stenosis. Aortic Valve: The aortic valve is normal in structure. Aortic valve regurgitation is not visualized. No aortic  stenosis is present. Pulmonic Valve: The pulmonic valve was normal in structure. Pulmonic valve regurgitation is trivial. No evidence of pulmonic stenosis. Aorta: Aortic dilatation noted. There is borderline dilatation of the aortic root, measuring 37 mm. Venous: The inferior vena cava was not well visualized. The inferior vena cava is normal in size with greater than 50% respiratory variability, suggesting right atrial pressure of 3 mmHg. IAS/Shunts: No atrial level shunt detected by color flow Doppler. Additional Comments: There is a moderate pleural effusion in the left lateral region.  LEFT VENTRICLE PLAX 2D LVOT diam:     2.90 cm LV SV:         73 LV SV Index:   34 LVOT Area:     6.61 cm  LV Volumes (MOD) LV vol d, MOD A4C: 161.0 ml LV vol s, MOD A4C: 92.0 ml LV SV MOD A4C:     161.0 ml RIGHT VENTRICLE TAPSE (M-mode): 1.2 cm LEFT ATRIUM           Index       RIGHT ATRIUM          Index LA Vol (A4C): 24.9 ml 11.56 ml/m RA Area:     6.84 cm                                   RA Volume:   8.85 ml  4.11 ml/m  AORTIC VALVE LVOT Vmax:   70.20 cm/s LVOT Vmean:  50.200 cm/s LVOT VTI:    0.111 m  AORTA Ao Root diam: 3.70 cm Ao Asc diam:  3.10 cm MITRAL VALVE               TRICUSPID VALVE MV Area (PHT): 3.74 cm    TR Peak grad:   12.4 mmHg MV Decel Time: 203 msec    TR Vmax:        176.00 cm/s MV E velocity: 73.30 cm/s MV A velocity: 80.60 cm/s  SHUNTS MV E/A ratio:  0.91        Systemic VTI:  0.11 m                            Systemic Diam: 2.90 cm Fransico Him MD Electronically signed by Fransico Him MD Signature Date/Time: 02/14/2020/1:33:44  PM    Final    IR THORACENTESIS ASP PLEURAL SPACE W/IMG GUIDE  Result Date: 02/14/2020 INDICATION: Patient with history of mediastinal mass, dyspnea, and right pleural effusion. Request made for diagnostic and therapeutic right thoracentesis. EXAM: ULTRASOUND GUIDED DIAGNOSTIC AND THERAPEUTIC RIGHT THORACENTESIS MEDICATIONS: 10 mL 1% lidocaine COMPLICATIONS: None immediate. PROCEDURE: An ultrasound guided thoracentesis was thoroughly discussed with the patient and questions answered. The benefits, risks, alternatives and complications were also discussed. The patient understands and wishes to proceed with the procedure. Written consent was obtained. Ultrasound was performed to localize and mark an adequate pocket of fluid in the right chest. The area was then prepped and draped in the normal sterile fashion. 1% Lidocaine was used for local anesthesia. Under ultrasound guidance a 6 Fr Safe-T-Centesis catheter was introduced. Thoracentesis was performed. The catheter was removed and a dressing applied. FINDINGS: A total of approximately 1.2 L of hazy gold fluid was removed. Samples were sent to the laboratory as requested by the clinical team. IMPRESSION: Successful ultrasound guided right thoracentesis yielding 1.2 L of pleural fluid. Read by: Earley Abide, PA-C  Electronically Signed   By: Jacqulynn Cadet M.D.   On: 02/14/2020 15:38    Cardiac Studies   Echo 02/15/20: IMPRESSIONS    1. Left ventricular ejection fraction, by estimation, is 60 to 65%. The  left ventricle has normal function. The left ventricle has no regional  wall motion abnormalities. Left ventricular diastolic function could not  be evaluated.  2. Right ventricular systolic function is normal. The right ventricular  size is normal. Tricuspid regurgitation signal is inadequate for assessing  PA pressure.  3. There is a moderate to large circumferential pericardial effusion  measuring 2.4cm at greatest diameter anteriorly  over the RV free wall and  apex. The apical 4 chamber view is forshortened so not accurate assessment  of MV inflow. There is significant  RA diastolic collapse and intermittent RV diastolic collapse. The IVC is  not well visualized. Worrisome echo for at least pre tamponade. Clincal  correlation recommended.  4. The mitral valve is normal in structure. No evidence of mitral valve  regurgitation. No evidence of mitral stenosis.  5. The aortic valve is normal in structure. Aortic valve regurgitation is  not visualized. No aortic stenosis is present.  6. Aortic dilatation noted. There is borderline dilatation of the aortic  root, measuring 37 mm.  7. The inferior vena cava is normal in size with greater than 50%  respiratory variability, suggesting right atrial pressure of 3 mmHg.  8. Findings discussed with Dr. Sherry Ruffing and recommend Cardiology consult.   Patient Profile     23 y.o. male  With large mediastinal mass and pleural and pericardial effusions  Assessment & Plan    1. Large anterior mediastinal mass. Plan IR guided biopsy of mass today +/- bone marrow biopsy. Need tissue diagnosis. 2. Pericardial effusion. Really no evidence of tamponade. No clinical evidence of tamponade. Jugular veins are completely flat. BP stable. Not a candidate for pericardial tap. If he does develop tamponade/clinical deterioration would need surgical window. 3. Ectopic atrial tachycardia. Paroxysmal. Related to mass/effusions. Will monitor. Avoid beta blocker for now to avoid hypotension 4. Pleural effusions. R>L s/p thoracentesis on the right .      For questions or updates, please contact [place] Please consult [email or web address] for contact info under        Signed, Rosela Supak [place], MD  02/15/2020, 7:36 AM

## 2020-02-15 NOTE — Procedures (Signed)
Interventional Radiology Procedure Note  Procedure: CT guided biopsy of mediastinal mass Complications: None Recommendations: - OK to advance diet per primary team - Follow up pathology     Signed,  Corrie Mckusick, DO

## 2020-02-15 NOTE — Progress Notes (Addendum)
HEMATOLOGY-ONCOLOGY PROGRESS NOTE  SUBJECTIVE: Yesterday's events have been noted.  Now in the ICU due to worsening shortness of breath following thoracentesis.  Has significant improvement in his breathing today. Had biopsy of mediastinal mass earlier today.  Reports mild discomfort at the biopsy site.  Bone marrow biopsy not performed earlier today due to concern for respiratory difficulty.  REVIEW OF SYSTEMS:   Constitutional: Denies fevers and chills Eyes: Denies blurriness of vision Ears, nose, mouth, throat, and face: Denies mucositis or sore throat Respiratory: Shortness of breath improved Cardiovascular: Denies palpitation, chest discomfort Gastrointestinal:  Denies nausea, heartburn or change in bowel habits Skin: Denies abnormal skin rashes Lymphatics: Denies new lymphadenopathy or easy bruising Neurological:Denies numbness, tingling or new weaknesses Behavioral/Psych: Mood is stable, no new changes  Extremities: No lower extremity edema All other systems were reviewed with the patient and are negative.  I have reviewed the past medical history, past surgical history, social history and family history with the patient and they are unchanged from previous note.   PHYSICAL EXAMINATION: ECOG PERFORMANCE STATUS: 1 - Symptomatic but completely ambulatory  Vitals:   02/15/20 1115 02/15/20 1120  BP: 126/74 127/74  Pulse: 94 98  Resp: (!) 28 (!) 28  Temp:    SpO2: 99% 100%   Filed Weights   02/12/20 2238  Weight: 85 kg    Intake/Output from previous day: 12/15 0701 - 12/16 0700 In: 1037.5 [I.V.:1037.5] Out: 350 [Urine:350]  GENERAL:alert, no distress and comfortable SKIN: skin color, texture, turgor are normal, no rashes or significant lesions LYMPH:  no palpable lymphadenopathy in the cervical, axillary or inguinal LUNGS: Diminished breath sounds on the right HEART: regular rate & rhythm and no murmurs and no lower extremity edema ABDOMEN:abdomen soft, non-tender and  normal bowel sounds Musculoskeletal:no cyanosis of digits and no clubbing  NEURO: alert & oriented x 3 with fluent speech, no focal motor/sensory deficits  LABORATORY DATA:  I have reviewed the data as listed CMP Latest Ref Rng & Units 02/15/2020 02/15/2020 02/14/2020  Glucose 70 - 99 mg/dL 149(H) 144(H) 96  BUN 6 - 20 mg/dL 24(H) 24(H) 19  Creatinine 0.61 - 1.24 mg/dL 1.16 1.13 1.19  Sodium 135 - 145 mmol/L 135 133(L) 138  Potassium 3.5 - 5.1 mmol/L 5.8(H) 6.6(HH) 4.7  Chloride 98 - 111 mmol/L 104 103 104  CO2 22 - 32 mmol/L 20(L) 18(L) 23  Calcium 8.9 - 10.3 mg/dL 9.1 8.9 9.4  Total Protein 6.5 - 8.1 g/dL - 7.5 -  Total Bilirubin 0.3 - 1.2 mg/dL - 1.0 -  Alkaline Phos 38 - 126 U/L - 49 -  AST 15 - 41 U/L - 29 -  ALT 0 - 44 U/L - 27 -    Lab Results  Component Value Date   WBC 30.0 (H) 02/15/2020   HGB 15.8 02/15/2020   HCT 46.1 02/15/2020   MCV 84.9 02/15/2020   PLT 189 02/15/2020   NEUTROABS 15.0 (H) 02/15/2020    DG Chest 1 View  Result Date: 02/14/2020 CLINICAL DATA:  Post right thoracentesis EXAM: CHEST  1 VIEW COMPARISON:  CT from the previous day FINDINGS: No pneumothorax. Large mass centered in the right hilum extending into the right lower chest and to the left hilum as before. Heart size difficult to assess due to adjacent opacity. Cannot exclude small residual effusions. Visualized bones unremarkable. IMPRESSION: No pneumothorax post  right thoracentesis Electronically Signed   By: Lucrezia Europe M.D.   On: 02/14/2020 16:57   CT  ANGIO CHEST PE W OR WO CONTRAST  Result Date: 02/13/2020 CLINICAL DATA:  Chest congestion and dry cough for the past week. EXAM: CT ANGIOGRAPHY CHEST WITH CONTRAST TECHNIQUE: Multidetector CT imaging of the chest was performed using the standard protocol during bolus administration of intravenous contrast. Multiplanar CT image reconstructions and MIPs were obtained to evaluate the vascular anatomy. CONTRAST:  75mL OMNIPAQUE IOHEXOL 350 MG/ML  SOLN COMPARISON:  MR chest dated September 21, 2019. CT chest dated September 07, 2019. FINDINGS: Cardiovascular: Satisfactory opacification of the pulmonary arteries to the segmental level. No evidence of pulmonary embolism. Narrowing of the right pulmonary artery due to mediastinal mass effect. Normal heart size, displaced to the left. New small pericardial effusion. No thoracic aortic aneurysm or dissection. 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 The mass abuts the right side of the heart and superior vena cava, which is narrowed distally. There is also narrowing of the right pulmonary artery and right mainstem bronchus. The mass extends superiorly to the base of the great vessels and also now appears to invade the right upper lobe (series 6, image 59). New enlarged right hilar lymph nodes measuring up to 1.4 cm in short axis. No enlarged mediastinal or axillary lymph nodes. The thyroid gland, trachea, and esophagus demonstrate no significant findings. Lungs/Pleura: New moderate right pleural effusion, mostly subpulmonic. New trace left pleural effusion. New atelectasis in the right upper and lower lobes. No consolidation or pneumothorax. Upper Abdomen: No acute abnormality. Musculoskeletal: No chest wall abnormality. No acute or significant osseous findings. Review of the MIP images confirms the above findings. IMPRESSION: 1. No evidence of pulmonary embolism. 2. 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 Differential considerations include thymic neoplasm, lymphoma, or germ-cell tumor. 3. New moderate right and trace  left pleural effusions. 4. New small pericardial effusion. Electronically Signed   By: William T Derry M.D.   On: 02/13/2020 15:05   CT guided needle placement  Result Date: 02/15/2020 INDICATION: 24-year-old male with mediastinal mass concerning for lymphoma referred for biopsy EXAM: CT-GUIDED BIOPSY OF MEDIASTINAL MASS MEDICATIONS: None. ANESTHESIA/SEDATION: Moderate (conscious) sedation was employed during this procedure. A total of Versed 1.0 mg and Fentanyl 0 mcg was administered intravenously. Moderate Sedation Time: 0 minutes. The patient's level of consciousness and vital signs were monitored continuously by radiology nursing throughout the procedure under my direct supervision. FLUOROSCOPY TIME:  CT COMPLICATIONS: None PROCEDURE: The procedure, risks, benefits, and alternatives were explained to the patient and the patient's family. Specific risks that were addressed included bleeding, infection, pneumothorax, need for further procedure including chest tube placement, chance of delayed pneumothorax or hemorrhage, hemoptysis, nondiagnostic sample, cardiopulmonary collapse, death. Questions regarding the procedure were encouraged and answered. The patient understands and consents to the procedure. Patient was positioned in a reclined, supine position on the CT gantry table and a scout CT of the chest was performed for planning purposes. Once angle of approach was determined, the skin and subcutaneous tissues this scan was prepped and draped in the usual sterile fashion, and a sterile drape was applied covering the operative field. A sterile gown and sterile gloves were used for   the procedure. Local anesthesia was provided with 1% Lidocaine. The skin and subcutaneous tissues were infiltrated 1% lidocaine for local anesthesia, and a small stab incision was made with an 11 blade scalpel. Using CT guidance, a 15 gauge trocar needle was advanced into the mediastinal masstarget. After confirmation of the tip,  separate 16 gauge core biopsies were performed. These were placed into saline solution for transportation to the lab. Biosentry Device was deployed. Patient tolerated the procedure well and remained hemodynamically stable throughout. No complications were encountered and no significant blood loss was encounter IMPRESSION: Status post CT-guided biopsy of mediastinal mass. Signed, Jaime S. Wagner, DO, RPVI [facility] Electronically Signed   By: Jaime  Wagner D.O.   On: 02/15/2020 12:22   CT ABDOMEN PELVIS W CONTRAST  Result Date: 02/15/2020 CLINICAL DATA:  24-year-old male with history of large mediastinal mass. Evaluate for lymphadenopathy in the abdomen or pelvis. EXAM: CT ABDOMEN AND PELVIS WITH CONTRAST TECHNIQUE: Multidetector CT imaging of the abdomen and pelvis was performed using the standard protocol following bolus administration of intravenous contrast. CONTRAST:  100mL OMNIPAQUE IOHEXOL 300 MG/ML  SOLN COMPARISON:  Chest CT 02/13/2020. CT the abdomen and pelvis 09/07/2019. FINDINGS: Lower chest: Large anterior mediastinal mass and right-sided pleural effusion, similar to recent chest CT (please see report for chest CT 02/13/2020 for full description). New trace left pleural effusion also noted. Hepatobiliary: No suspicious cystic or solid hepatic lesions. No intra or extrahepatic biliary ductal dilatation. Intermediate attenuation material lying dependently in the gallbladder, compatible with biliary sludge. No findings to suggest an acute cholecystitis at this time. Pancreas: No pancreatic mass. No pancreatic ductal dilatation. No pancreatic or peripancreatic fluid collections or inflammatory changes. Spleen: Normal in size, and otherwise unremarkable. Adrenals/Urinary Tract: Subcentimeter low-attenuation lesions in both kidneys, too small to definitively characterize, but statistically likely to represent tiny cysts. No  hydroureteronephrosis. Urinary bladder is normal in appearance. Bilateral adrenal glands are normal in appearance. Stomach/Bowel: Normal appearance of the stomach. No pathologic dilatation of small bowel or colon. The appendix is not confidently identified and may be surgically absent. Regardless, there are no inflammatory changes noted adjacent to the cecum to suggest the presence of an acute appendicitis at this time. Vascular/Lymphatic: No aneurysm identified in the visualized abdominal vasculature. No definite lymphadenopathy identified in the abdomen or pelvis. Reproductive: Prostate gland and seminal vesicles are unremarkable in appearance. Other: No significant volume of ascites.  No pneumoperitoneum. Musculoskeletal: There are no aggressive appearing lytic or blastic lesions noted in the visualized portions of the skeleton. IMPRESSION: 1. No definite lymphadenopathy identified in the abdomen or pelvis. 2. No splenomegaly. 3. Large anterior mediastinal mass and large right pleural effusion, similar to the prior study. New trace left pleural effusion lying dependently. Electronically Signed   By: Daniel  Entrikin M.D.   On: 02/15/2020 12:12   DG Chest Port 1 View  Result Date: 02/14/2020 CLINICAL DATA:  Worsening shortness of breath after thoracentesis EXAM: PORTABLE CHEST 1 VIEW COMPARISON:  02/14/2020, CT 02/13/2020 FINDINGS: Small bilateral pleural effusions, mildly decreased on the right side. Airspace disease at both bases. Abnormal cardiomediastinal silhouette corresponding to large mediastinal mass on prior imaging. No pneumothorax is seen. IMPRESSION: 1. Decreased size of right pleural effusion without pneumothorax. Persistent small bilateral pleural effusions and bibasilar airspace disease. 2. Large mediastinal mass. Electronically Signed   By: Kim  Fujinaga M.D.   On: 02/14/2020 18:19   ECHOCARDIOGRAM COMPLETE  Result Date: 02/14/2020      ECHOCARDIOGRAM REPORT   Patient Name:   Fernando A  Hickman Date of Exam: 02/14/2020 Medical Rec #:  [id number]      Height:       76.0 in Accession #:    [id number]     Weight:       187.4 lb Date of Birth:  07/03/1995      BSA:          2.155 m Patient Age:    24 years       BP:           113/61 mmHg Patient Gender: M              HR:           90 bpm. Exam Location:  Inpatient Procedure: 2D Echo, Cardiac Doppler and Color Doppler Indications:    Pericardial effusion 423.9 / I31.3  History:        Patient has no prior history of Echocardiogram examinations.                 Risk Factors:Non-Smoker.  Sonographer:    Julia Swaim RDCS Referring Phys: [id number] KRISTIN R CURCIO IMPRESSIONS  1. Left ventricular ejection fraction, by estimation, is 60 to 65%. The left ventricle has normal function. The left ventricle has no regional wall motion abnormalities. Left ventricular diastolic function could not be evaluated.  2. Right ventricular systolic function is normal. The right ventricular size is normal. Tricuspid regurgitation signal is inadequate for assessing PA pressure.  3. There is a moderate to large circumferential pericardial effusion measuring 2.4cm at greatest diameter anteriorly over the RV free wall and apex. The apical 4 chamber view is forshortened so not accurate assessment of MV inflow. There is significant RA diastolic collapse and intermittent RV diastolic collapse. The IVC is not well visualized. Worrisome echo for at least pre tamponade. Clincal correlation recommended.  4. The mitral valve is normal in structure. No evidence of mitral valve regurgitation. No evidence of mitral stenosis.  5. The aortic valve is normal in structure. Aortic valve regurgitation is not visualized. No aortic stenosis is present.  6. Aortic dilatation noted. There is borderline dilatation of the aortic root, measuring 37 mm.  7. The inferior vena cava is normal in size with greater than 50% respiratory variability, suggesting right atrial pressure of 3 mmHg.  8. Findings discussed  with Dr. Krienke and recommend Cardiology consult. FINDINGS  Left Ventricle: Left ventricular ejection fraction, by estimation, is 60 to 65%. The left ventricle has normal function. The left ventricle has no regional wall motion abnormalities. The left ventricular internal cavity size was normal in size. There is  no left ventricular hypertrophy. Left ventricular diastolic function could not be evaluated. Right Ventricle: The right ventricular size is normal. No increase in right ventricular wall thickness. Right ventricular systolic function is normal. Tricuspid regurgitation signal is inadequate for assessing PA pressure. Left Atrium: Left atrial size was normal in size. Right Atrium: Right atrial size was normal in size. Pericardium: There is a moderate to large circumferential pericardial effusion measuring 2.4cm at greatest diameter anteriorly over the RV free wall and apex. The apical 4 chamber view is forshortened so not accurate assessment of MV inflow. There is significant RA diastolic collapse and intermittent RV diastolic collapse. The IVC is not well visualized. Worrisome echo for at least pre tamponade. Clincal correlation recommended. Report was called to Dr. A large pericardial effusion is present. The pericardial effusion is circumferential. There is   diastolic collapse of the right atrial wall and diastolic collapse of the right ventricular free wall. Mitral Valve: The mitral valve is normal in structure. No evidence of mitral valve regurgitation. No evidence of mitral valve stenosis. Tricuspid Valve: The tricuspid valve is normal in structure. Tricuspid valve regurgitation is trivial. No evidence of tricuspid stenosis. Aortic Valve: The aortic valve is normal in structure. Aortic valve regurgitation is not visualized. No aortic stenosis is present. Pulmonic Valve: The pulmonic valve was normal in structure. Pulmonic valve regurgitation is trivial. No evidence of pulmonic stenosis. Aorta: Aortic  dilatation noted. There is borderline dilatation of the aortic root, measuring 37 mm. Venous: The inferior vena cava was not well visualized. The inferior vena cava is normal in size with greater than 50% respiratory variability, suggesting right atrial pressure of 3 mmHg. IAS/Shunts: No atrial level shunt detected by color flow Doppler. Additional Comments: There is a moderate pleural effusion in the left lateral region.  LEFT VENTRICLE PLAX 2D LVOT diam:     2.90 cm LV SV:         73 LV SV Index:   34 LVOT Area:     6.61 cm  LV Volumes (MOD) LV vol d, MOD A4C: 161.0 ml LV vol s, MOD A4C: 92.0 ml LV SV MOD A4C:     161.0 ml RIGHT VENTRICLE TAPSE (M-mode): 1.2 cm LEFT ATRIUM           Index       RIGHT ATRIUM          Index LA Vol (A4C): 24.9 ml 11.56 ml/m RA Area:     6.84 cm                                   RA Volume:   8.85 ml  4.11 ml/m  AORTIC VALVE LVOT Vmax:   70.20 cm/s LVOT Vmean:  50.200 cm/s LVOT VTI:    0.111 m  AORTA Ao Root diam: 3.70 cm Ao Asc diam:  3.10 cm MITRAL VALVE               TRICUSPID VALVE MV Area (PHT): 3.74 cm    TR Peak grad:   12.4 mmHg MV Decel Time: 203 msec    TR Vmax:        176.00 cm/s MV E velocity: 73.30 cm/s MV A velocity: 80.60 cm/s  SHUNTS MV E/A ratio:  0.91        Systemic VTI:  0.11 m                            Systemic Diam: 2.90 cm Traci Turner MD Electronically signed by Traci Turner MD Signature Date/Time: 02/14/2020/1:33:44 PM    Final    IR THORACENTESIS ASP PLEURAL SPACE W/IMG GUIDE  Result Date: 02/14/2020 INDICATION: Patient with history of mediastinal mass, dyspnea, and right pleural effusion. Request made for diagnostic and therapeutic right thoracentesis. EXAM: ULTRASOUND GUIDED DIAGNOSTIC AND THERAPEUTIC RIGHT THORACENTESIS MEDICATIONS: 10 mL 1% lidocaine COMPLICATIONS: None immediate. PROCEDURE: An ultrasound guided thoracentesis was thoroughly discussed with the patient and questions answered. The benefits, risks, alternatives and complications  were also discussed. The patient understands and wishes to proceed with the procedure. Written consent was obtained. Ultrasound was performed to localize and mark an adequate pocket of fluid in the right chest. The area was then prepped and draped in   the normal sterile fashion. 1% Lidocaine was used for local anesthesia. Under ultrasound guidance a 6 Fr Safe-T-Centesis catheter was introduced. Thoracentesis was performed. The catheter was removed and a dressing applied. FINDINGS: A total of approximately 1.2 L of hazy gold fluid was removed. Samples were sent to the laboratory as requested by the clinical team. IMPRESSION: Successful ultrasound guided right thoracentesis yielding 1.2 L of pleural fluid. Read by: Earley Abide, PA-C Electronically Signed   By: Jacqulynn Cadet M.D.   On: 02/14/2020 15:38    ASSESSMENT AND PLAN: This is a 24 year old male with:  1.  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 -02/13/2020 CTA chest "1. No evidence of pulmonary embolism. 2. 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 Differential considerations include thymic neoplasm, lymphoma, or germ-cell tumor. 3. New moderate right and trace left pleural effusions. 4. New small pericardial effusion." -02/13/2020 beta-hCG <1, AFP 4.1 -02/15/2020 CT abdomen/pelvis "1. No definite lymphadenopathy identified in the abdomen or pelvis. 2. No splenomegaly. 3. Large anterior mediastinal mass and large right pleural effusion, similar to the prior study. New trace left pleural effusion lying dependently." -02/13/2020 flow cytometry-results pending.  Discussed with pathology today who indicates that there is a predominant T-cell lymphoproliferative  process.  CD10 positive, CD3 negative.  Final diagnosis pending. -02/14/2020 ultrasound-guided thoracentesis with 1.2 L of fluid removed.  Cytology pending. -02/15/2020 biopsy of mediastinal mass.  Pathology results pending. -Discussed findings to date with the patient and his father who was at the bedside.  They are aware they are awaiting final biopsy results to finalize treatment plan. -For now, will place on dexamethasone 20 mg daily x3 days.  2.  Leukocytosis -Could be due to underlying bone marrow involvement from lymphoma/leukemia. Neutrophilia can also be seen with Hodgkins lymphoma.  Also on steroids. -Flow cytometry ordered and results pending -CT guided bone marrow aspiration and biopsy has been ordered.  Was due to be done earlier today but due to shortness of breath, this was held.  Will need to readdress obtaining a bone marrow biopsy when the patient is more stable.  3.  Pericardial effusion -Small pericardial effusion noted on CT scan but noted to be moderate to large on echocardiogram -Cardiology and cardiothoracic surgery are following.  4.  Hyperuricemia, hyperkalemia, hyperphosphatemia concerning for tumor lysis syndrome -Administer rasburicase per PCCM -We will plan to start on allopurinol once final diagnosis is made.  5.  J[employer] -The patient does not accept blood products.   LOS: 1 day   Mikey Bussing, DNP, AGPCNP-BC, AOCNP 02/15/20  ADDENDUM  .Patient was Personally and independently interviewed, examined and relevant elements of the history of present illness were reviewed in details and an assessment and plan was created. All elements of the patient's history of present illness , assessment and plan were discussed in details with Mikey Bussing, DNP, AGPCNP-BC,  The above documentation reflects our combined findings assessment and plan.  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  3) Pericardial effusion mod-severe with some pre-tamponade changes on ECHO  4) B/l pleural effusion - related to leukemia/lymphoma PLAN  - patient notes breathing  and symptoms much improved with steroids. Better po fluid intake -IVF -Mx of hyperkalemia per ICU team -received Rasburicase for TLS. Started allopurinol 176m po BID for TLS prophylaxis. -discussed all available labs, imaging results with patient and family at bedside in details. -continue Dexamethasone 251mpo daily x 3-4 days -discussed likely diagnosis, pending pathology results. Discussed that if T-ALL would recommend transfer to [facility] due to availability of dedicated leukemia/lymphoma service. -given his age if T-ALL -- would recommend rx with pediatric treatment protocol for maximum success as opposed to an adult rregimen like HyperCVAD -I unequivocally counseled patient that these treatment regimen will typically need transfusion support and likely consolidation with allogeneic HSCT and this is an important consideration since he is a [employer]. He is aware and understands that aggressive rx protocol might be limited if blood transfusion support is declined completely as it is being done currently. -will need BM Bx tomorrow if stable -discussed need for staging lumbar puncture -- which he currently declines. -will continue follow.   GaSullivan LoneD MS TT 60 mins > 50% on direct patient contact, counseling and co-ordination of cares.

## 2020-02-15 NOTE — Progress Notes (Addendum)
[facility] DAILY ICU PROGRESS NOTE                   [address]            ,[place] [id number]          [phone]       Total Length of Stay:  LOS: 1 day   Subjective: Patient more comfortable this morning, not coughing, able to sit upright in the bed comfortably  Objective: Vital signs in last 24 hours: Temp:  [98.4 F (36.9 C)-99.2 F (37.3 C)] 99 F (37.2 C) (12/16 0000) Pulse Rate:  [45-212] 45 (12/16 0900) Cardiac Rhythm: Sinus tachycardia (12/15 2000) Resp:  [18-36] 30 (12/16 0900) BP: (91-146)/(38-127) 139/95 (12/16 0900) SpO2:  [75 %-100 %] 100 % (12/16 0900) FiO2 (%):  [60 %] 60 % (12/15 1848)  Filed Weights   02/12/20 2238  Weight: 85 kg    Weight change:    Hemodynamic parameters for last 24 hours:    Intake/Output from previous day: 12/15 0701 - 12/16 0700 In: 1037.5 [I.V.:1037.5] Out: 350 [Urine:350]  Intake/Output this shift: Total I/O In: 0  Out: 800 [Urine:800]  Current Meds: Scheduled Meds: . Chlorhexidine Gluconate Cloth  6 each Topical Daily  . dexamethasone (DECADRON) injection  4 mg Intravenous Q6H  . dextromethorphan  15 mg Oral BID  . lidocaine      . sodium chloride flush  3 mL Intravenous Q12H   Continuous Infusions: . sodium chloride    . sodium chloride 75 mL/hr at 02/15/20 0545   PRN Meds:.sodium chloride, benzonatate, lidocaine, sodium chloride flush  General appearance: alert, cooperative and mild distress Neurologic: intact Heart: regular rate and rhythm, S1, S2 normal, no murmur, click, rub or gallop Lungs: diminished breath sounds bibasilar Extremities: extremities normal, atraumatic, no cyanosis or edema  Lab Results: CBC: Recent Labs    02/14/20 0602 02/15/20 0627  WBC 29.6* 30.0*  HGB 13.5 15.8  HCT 40.1 46.1  PLT 178 189   BMET:  Recent Labs    02/15/20 0627 02/15/20 0754  NA 133* 135  K 6.6* 5.8*  CL 103 104  CO2 18* 20*  GLUCOSE 144* 149*  BUN 24* 24*  CREATININE 1.13 1.16   CALCIUM 8.9 9.1    CMET: Lab Results  Component Value Date   WBC 30.0 (H) 02/15/2020   HGB 15.8 02/15/2020   HCT 46.1 02/15/2020   PLT 189 02/15/2020   GLUCOSE 149 (H) 02/15/2020   CHOL 120 10/08/2016   TRIG 58 10/08/2016   HDL 54 10/08/2016   LDLCALC 54 10/08/2016   ALT 27 02/15/2020   AST 29 02/15/2020   NA 135 02/15/2020   K 5.8 (H) 02/15/2020   CL 104 02/15/2020   CREATININE 1.16 02/15/2020   BUN 24 (H) 02/15/2020   CO2 20 (L) 02/15/2020   TSH 1.701 02/13/2020   INR 1.1 09/07/2019   HGBA1C 4.9 10/08/2016      PT/INR: No results for input(s): LABPROT, INR in the last 72 hours. Radiology: DG Chest 1 View  Result Date: 02/14/2020 CLINICAL DATA:  Post right thoracentesis EXAM: CHEST  1 VIEW COMPARISON:  CT from the previous day FINDINGS: No pneumothorax. Large mass centered in the right hilum extending into the right lower chest and to the left hilum as before. Heart size difficult to assess due to adjacent opacity. Cannot exclude small residual effusions. Visualized bones unremarkable. IMPRESSION: No pneumothorax post  right thoracentesis Electronically Signed  By: Lucrezia Europe M.D.   On: 02/14/2020 16:57   DG Chest Port 1 View  Result Date: 02/14/2020 CLINICAL DATA:  Worsening shortness of breath after thoracentesis EXAM: PORTABLE CHEST 1 VIEW COMPARISON:  02/14/2020, CT 02/13/2020 FINDINGS: Small bilateral pleural effusions, mildly decreased on the right side. Airspace disease at both bases. Abnormal cardiomediastinal silhouette corresponding to large mediastinal mass on prior imaging. No pneumothorax is seen. IMPRESSION: 1. Decreased size of right pleural effusion without pneumothorax. Persistent small bilateral pleural effusions and bibasilar airspace disease. 2. Large mediastinal mass. Electronically Signed   By: Donavan Foil M.D.   On: 02/14/2020 18:19   ECHOCARDIOGRAM COMPLETE  Result Date: 02/14/2020    ECHOCARDIOGRAM REPORT   Patient Name:   BEHR CISLO Geiger Date  of Exam: 02/14/2020 Medical Rec #:  [id number]      Height:       76.0 in Accession #:    [id number]     Weight:       187.4 lb Date of Birth:  1995/06/12      BSA:          2.155 m Patient Age:    24 years       BP:           113/61 mmHg Patient Gender: M              HR:           90 bpm. Exam Location:  Inpatient Procedure: 2D Echo, Cardiac Doppler and Color Doppler Indications:    Pericardial effusion 423.9 / I31.3  History:        Patient has no prior history of Echocardiogram examinations.                 Risk Factors:Non-Smoker.  Sonographer:    Vickie Epley RDCS Referring Phys: [place]  1. Left ventricular ejection fraction, by estimation, is 60 to 65%. The left ventricle has normal function. The left ventricle has no regional wall motion abnormalities. Left ventricular diastolic function could not be evaluated.  2. Right ventricular systolic function is normal. The right ventricular size is normal. Tricuspid regurgitation signal is inadequate for assessing PA pressure.  3. There is a moderate to large circumferential pericardial effusion measuring 2.4cm at greatest diameter anteriorly over the RV free wall and apex. The apical 4 chamber view is forshortened so not accurate assessment of MV inflow. There is significant RA diastolic collapse and intermittent RV diastolic collapse. The IVC is not well visualized. Worrisome echo for at least pre tamponade. Clincal correlation recommended.  4. The mitral valve is normal in structure. No evidence of mitral valve regurgitation. No evidence of mitral stenosis.  5. The aortic valve is normal in structure. Aortic valve regurgitation is not visualized. No aortic stenosis is present.  6. Aortic dilatation noted. There is borderline dilatation of the aortic root, measuring 37 mm.  7. The inferior vena cava is normal in size with greater than 50% respiratory variability, suggesting right atrial pressure of 3 mmHg.  8. Findings discussed with Dr.  Sherry Ruffing and recommend Cardiology consult. FINDINGS  Left Ventricle: Left ventricular ejection fraction, by estimation, is 60 to 65%. The left ventricle has normal function. The left ventricle has no regional wall motion abnormalities. The left ventricular internal cavity size was normal in size. There is  no left ventricular hypertrophy. Left ventricular diastolic function could not be evaluated. Right Ventricle: The right ventricular size is normal. No increase in  right ventricular wall thickness. Right ventricular systolic function is normal. Tricuspid regurgitation signal is inadequate for assessing PA pressure. Left Atrium: Left atrial size was normal in size. Right Atrium: Right atrial size was normal in size. Pericardium: There is a moderate to large circumferential pericardial effusion measuring 2.4cm at greatest diameter anteriorly over the RV free wall and apex. The apical 4 chamber view is forshortened so not accurate assessment of MV inflow. There is significant RA diastolic collapse and intermittent RV diastolic collapse. The IVC is not well visualized. Worrisome echo for at least pre tamponade. Clincal correlation recommended. Report was called to Dr. Loni Muse large pericardial effusion is present. The pericardial effusion is circumferential. There is diastolic collapse of the right atrial wall and diastolic collapse of the right ventricular free wall. Mitral Valve: The mitral valve is normal in structure. No evidence of mitral valve regurgitation. No evidence of mitral valve stenosis. Tricuspid Valve: The tricuspid valve is normal in structure. Tricuspid valve regurgitation is trivial. No evidence of tricuspid stenosis. Aortic Valve: The aortic valve is normal in structure. Aortic valve regurgitation is not visualized. No aortic stenosis is present. Pulmonic Valve: The pulmonic valve was normal in structure. Pulmonic valve regurgitation is trivial. No evidence of pulmonic stenosis. Aorta: Aortic dilatation  noted. There is borderline dilatation of the aortic root, measuring 37 mm. Venous: The inferior vena cava was not well visualized. The inferior vena cava is normal in size with greater than 50% respiratory variability, suggesting right atrial pressure of 3 mmHg. IAS/Shunts: No atrial level shunt detected by color flow Doppler. Additional Comments: There is a moderate pleural effusion in the left lateral region.  LEFT VENTRICLE PLAX 2D LVOT diam:     2.90 cm LV SV:         73 LV SV Index:   34 LVOT Area:     6.61 cm  LV Volumes (MOD) LV vol d, MOD A4C: 161.0 ml LV vol s, MOD A4C: 92.0 ml LV SV MOD A4C:     161.0 ml RIGHT VENTRICLE TAPSE (M-mode): 1.2 cm LEFT ATRIUM           Index       RIGHT ATRIUM          Index LA Vol (A4C): 24.9 ml 11.56 ml/m RA Area:     6.84 cm                                   RA Volume:   8.85 ml  4.11 ml/m  AORTIC VALVE LVOT Vmax:   70.20 cm/s LVOT Vmean:  50.200 cm/s LVOT VTI:    0.111 m  AORTA Ao Root diam: 3.70 cm Ao Asc diam:  3.10 cm MITRAL VALVE               TRICUSPID VALVE MV Area (PHT): 3.74 cm    TR Peak grad:   12.4 mmHg MV Decel Time: 203 msec    TR Vmax:        176.00 cm/s MV E velocity: 73.30 cm/s MV A velocity: 80.60 cm/s  SHUNTS MV E/A ratio:  0.91        Systemic VTI:  0.11 m                            Systemic Diam: 2.90 cm Fransico Him MD Electronically signed by Fransico Him MD Signature  Date/Time: 02/14/2020/1:33:44 PM    Final    IR THORACENTESIS ASP PLEURAL SPACE W/IMG GUIDE  Result Date: 02/14/2020 INDICATION: Patient with history of mediastinal mass, dyspnea, and right pleural effusion. Request made for diagnostic and therapeutic right thoracentesis. EXAM: ULTRASOUND GUIDED DIAGNOSTIC AND THERAPEUTIC RIGHT THORACENTESIS MEDICATIONS: 10 mL 1% lidocaine COMPLICATIONS: None immediate. PROCEDURE: An ultrasound guided thoracentesis was thoroughly discussed with the patient and questions answered. The benefits, risks, alternatives and complications were also  discussed. The patient understands and wishes to proceed with the procedure. Written consent was obtained. Ultrasound was performed to localize and mark an adequate pocket of fluid in the right chest. The area was then prepped and draped in the normal sterile fashion. 1% Lidocaine was used for local anesthesia. Under ultrasound guidance a 6 Fr Safe-T-Centesis catheter was introduced. Thoracentesis was performed. The catheter was removed and a dressing applied. FINDINGS: A total of approximately 1.2 L of hazy gold fluid was removed. Samples were sent to the laboratory as requested by the clinical team. IMPRESSION: Successful ultrasound guided right thoracentesis yielding 1.2 L of pleural fluid. Read by: Earley Abide, PA-C Electronically Signed   By: Jacqulynn Cadet M.D.   On: 02/14/2020 15:38     Assessment/Plan: After discussion with critical care and heart failure team about his Jehovah's Witness status The patient and his family have requested that he be transferred.  Critical care team has been running into obstacles about transfer.     Ardeth Perfect 02/15/2020 10:28 AM

## 2020-02-16 ENCOUNTER — Inpatient Hospital Stay (HOSPITAL_COMMUNITY): Payer: BC Managed Care – PPO

## 2020-02-16 DIAGNOSIS — C9100 Acute lymphoblastic leukemia not having achieved remission: Secondary | ICD-10-CM

## 2020-02-16 DIAGNOSIS — C801 Malignant (primary) neoplasm, unspecified: Secondary | ICD-10-CM

## 2020-02-16 DIAGNOSIS — J9621 Acute and chronic respiratory failure with hypoxia: Secondary | ICD-10-CM

## 2020-02-16 DIAGNOSIS — I313 Pericardial effusion (noninflammatory): Secondary | ICD-10-CM

## 2020-02-16 LAB — BASIC METABOLIC PANEL
Anion gap: 16 — ABNORMAL HIGH (ref 5–15)
Anion gap: 11 (ref 5–15)
Anion gap: 12 (ref 5–15)
BUN: 33 mg/dL — ABNORMAL HIGH (ref 6–20)
BUN: 35 mg/dL — ABNORMAL HIGH (ref 6–20)
BUN: 30 mg/dL — ABNORMAL HIGH (ref 6–20)
CO2: 16 mmol/L — ABNORMAL LOW (ref 22–32)
CO2: 18 mmol/L — ABNORMAL LOW (ref 22–32)
CO2: 21 mmol/L — ABNORMAL LOW (ref 22–32)
Calcium: 8.9 mg/dL (ref 8.9–10.3)
Calcium: 8.3 mg/dL — ABNORMAL LOW (ref 8.9–10.3)
Calcium: 8.4 mg/dL — ABNORMAL LOW (ref 8.9–10.3)
Chloride: 104 mmol/L (ref 98–111)
Chloride: 105 mmol/L (ref 98–111)
Chloride: 103 mmol/L (ref 98–111)
Creatinine, Ser: 1.16 mg/dL (ref 0.61–1.24)
Creatinine, Ser: 1.06 mg/dL (ref 0.61–1.24)
Creatinine, Ser: 1.09 mg/dL (ref 0.61–1.24)
GFR, Estimated: >60 mL/min (ref >60)
GFR, Estimated: >60 mL/min (ref >60)
GFR, Estimated: >60 mL/min (ref >60)
Glucose, Bld: 158 mg/dL — ABNORMAL HIGH (ref 70–99)
Glucose, Bld: 154 mg/dL — ABNORMAL HIGH (ref 70–99)
Glucose, Bld: 150 mg/dL — ABNORMAL HIGH (ref 70–99)
Potassium: 5.4 mmol/L — ABNORMAL HIGH (ref 3.5–5.1)
Potassium: 5.4 mmol/L — ABNORMAL HIGH (ref 3.5–5.1)
Potassium: 5.8 mmol/L — ABNORMAL HIGH (ref 3.5–5.1)
Sodium: 136 mmol/L (ref 135–145)
Sodium: 134 mmol/L — ABNORMAL LOW (ref 135–145)
Sodium: 136 mmol/L (ref 135–145)

## 2020-02-16 LAB — CBC WITH DIFFERENTIAL/PLATELET
Abs Immature Granulocytes: 0.35 10*3/uL — ABNORMAL HIGH (ref 0.00–0.07)
Basophils Absolute: 0.1 10*3/uL (ref 0.0–0.1)
Basophils Relative: 1 %
Eosinophils Absolute: 0 10*3/uL (ref 0.0–0.5)
Eosinophils Relative: 0 %
HCT: 47.3 % (ref 39.0–52.0)
Hemoglobin: 15.8 g/dL (ref 13.0–17.0)
Immature Granulocytes: 2 %
Lymphocytes Relative: 37 %
Lymphs Abs: 5.9 10*3/uL — ABNORMAL HIGH (ref 0.7–4.0)
MCH: 28 pg (ref 26.0–34.0)
MCHC: 33.4 g/dL (ref 30.0–36.0)
MCV: 83.7 fL (ref 80.0–100.0)
Monocytes Absolute: 0.3 10*3/uL (ref 0.1–1.0)
Monocytes Relative: 2 %
Neutro Abs: 9.2 10*3/uL — ABNORMAL HIGH (ref 1.7–7.7)
Neutrophils Relative %: 58 %
Platelets: UNDETERMINED 10*3/uL (ref 150–400)
RBC: 5.65 MIL/uL (ref 4.22–5.81)
RDW: 14.2 % (ref 11.5–15.5)
WBC Morphology: INCREASED
WBC: 15.9 10*3/uL — ABNORMAL HIGH (ref 4.0–10.5)
nRBC: 0.1 % (ref 0.0–0.2)

## 2020-02-16 LAB — PHOSPHORUS: Phosphorus: 7.1 mg/dL — ABNORMAL HIGH (ref 2.5–4.6)

## 2020-02-16 LAB — LACTATE DEHYDROGENASE: LDH: 421 U/L — ABNORMAL HIGH (ref 98–192)

## 2020-02-16 LAB — RASBURICASE - URIC ACID: Uric Acid, Serum: 15.4 mg/dL — ABNORMAL HIGH (ref 3.7–8.6)

## 2020-02-16 LAB — CD19 AND CD20, FLOW CYTOMETRY

## 2020-02-16 MED ORDER — SODIUM CHLORIDE 0.9 % IV SOLN: 250.0000 mL | INTRAVENOUS | 0 refills | Status: AC | PRN

## 2020-02-16 MED ORDER — ALLOPURINOL 300 MG PO TABS: 150.0000 mg | ORAL_TABLET | Freq: Two times a day (BID) | ORAL | 0 refills | Status: AC

## 2020-02-16 MED ORDER — DEXTROMETHORPHAN POLISTIREX ER 30 MG/5ML PO SUER: 15.0000 mg | Freq: Two times a day (BID) | ORAL | 0 refills | Status: AC

## 2020-02-16 MED ORDER — NOREPINEPHRINE 4 MG/250ML-% IV SOLN
INTRAVENOUS | Status: AC
  Filled 2020-02-16: qty 250

## 2020-02-16 MED ORDER — SODIUM ZIRCONIUM CYCLOSILICATE 10 G PO PACK
10.0000 g | PACK | ORAL | Status: AC
  Administered 2020-02-16 (×2): 10 g via ORAL
  Filled 2020-02-16 (×2): qty 1

## 2020-02-16 MED ORDER — ENOXAPARIN SODIUM 40 MG/0.4ML ~~LOC~~ SOLN: 40.0000 mg | SUBCUTANEOUS | Status: AC

## 2020-02-16 MED ORDER — SODIUM CHLORIDE 0.9 % IV SOLN
6.0000 mg | Freq: Once | INTRAVENOUS | Status: AC
  Administered 2020-02-16: 16:00:00 6 mg via INTRAVENOUS
  Filled 2020-02-16: qty 4

## 2020-02-16 MED ORDER — ETOMIDATE 2 MG/ML IV SOLN
INTRAVENOUS | Status: AC
  Filled 2020-02-16: qty 20

## 2020-02-16 MED ORDER — MIDAZOLAM HCL 2 MG/2ML IJ SOLN
INTRAMUSCULAR | Status: AC
  Filled 2020-02-16: qty 4

## 2020-02-16 MED ORDER — SODIUM CHLORIDE 0.9 % IV SOLN: 125.0000 mL | INTRAVENOUS | 0 refills | Status: AC

## 2020-02-16 MED ORDER — LANTHANUM CARBONATE 500 MG PO CHEW: 500.0000 mg | CHEWABLE_TABLET | Freq: Three times a day (TID) | ORAL | 0 refills | Status: AC

## 2020-02-16 MED ORDER — SODIUM POLYSTYRENE SULFONATE 15 GM/60ML PO SUSP
15.0000 g | Freq: Once | ORAL | Status: AC
  Administered 2020-02-16: 21:00:00 15 g via ORAL
  Filled 2020-02-16: qty 60

## 2020-02-16 MED ORDER — EPINEPHRINE 1 MG/10ML IJ SOSY
PREFILLED_SYRINGE | INTRAMUSCULAR | Status: AC
  Filled 2020-02-16: qty 10

## 2020-02-16 MED ORDER — SODIUM CHLORIDE 0.9 % IV SOLN: 20.0000 mg | Freq: Every day | INTRAVENOUS | Status: AC

## 2020-02-16 MED ORDER — BENZONATATE 200 MG PO CAPS: 200.0000 mg | ORAL_CAPSULE | Freq: Three times a day (TID) | ORAL | 0 refills | Status: AC | PRN

## 2020-02-16 MED ORDER — CHLORHEXIDINE GLUCONATE CLOTH 2 % EX PADS: 6.0000 | MEDICATED_PAD | Freq: Every day | CUTANEOUS | Status: AC

## 2020-02-16 MED ORDER — FENTANYL CITRATE (PF) 100 MCG/2ML IJ SOLN
INTRAMUSCULAR | Status: AC
  Filled 2020-02-16: qty 2

## 2020-02-16 MED ORDER — ONDANSETRON HCL 4 MG/2ML IJ SOLN
4.0000 mg | Freq: Four times a day (QID) | INTRAMUSCULAR | Status: DC | PRN
  Administered 2020-02-16: 10:00:00 4 mg via INTRAVENOUS
  Filled 2020-02-16: qty 2

## 2020-02-16 MED ORDER — ROCURONIUM BROMIDE 10 MG/ML (PF) SYRINGE
PREFILLED_SYRINGE | INTRAVENOUS | Status: AC
  Filled 2020-02-16: qty 10

## 2020-02-16 MED ORDER — ONDANSETRON HCL 4 MG/2ML IJ SOLN: 4.0000 mg | Freq: Four times a day (QID) | INTRAMUSCULAR | 0 refills | Status: AC | PRN

## 2020-02-16 NOTE — Progress Notes (Signed)
IR consulted by Mikey Bussing, NP for possible image-guided bone marrow biopsy/aspiration.  Patient has been seen/consented for procedure (please see consult note from Odelia Gage, PA-C 02/14/2020 for further information on this).  Per notes patient requiring increased O2 for worsening dyspnea secondary to mediastinal mass (cannot tolerate BiPap, on 10L [place])- per notes high risk for intubation as well. At this time, patient not medically stable for biopsy (concern for respiratory difficulty during procedure). IR to follow-up next week regarding timing of bone marrow biopsy.  Please call IR with questions/concerns.   Bea Graff Breanna Shorkey, PA-C 02/16/2020, 3:34 PM

## 2020-02-16 NOTE — Progress Notes (Signed)
Progress Note  Patient Name: PRAVEEN COIA Date of Encounter: 02/16/2020  [facility] Cardiologist: No primary care provider on file. New  Subjective   Feels better now. Not coughing. Reports SOB improved. In no distress  Inpatient Medications    Scheduled Meds: . allopurinol  150 mg Oral BID  . Chlorhexidine Gluconate Cloth  6 each Topical Daily  . dextromethorphan  15 mg Oral BID  . enoxaparin (LOVENOX) injection  40 mg Subcutaneous Q24H  . lanthanum  500 mg Oral TID WC  . sodium chloride flush  3 mL Intravenous Q12H   Continuous Infusions: . sodium chloride    . sodium chloride 125 mL/hr at 02/16/20 0700  . dexamethasone (DECADRON) IVPB ([facility]) Stopped (02/15/20 1528)   PRN Meds: sodium chloride, benzonatate, sodium chloride flush   Vital Signs    Vitals:   02/16/20 0400 02/16/20 0500 02/16/20 0600 02/16/20 0700  BP: 122/81 123/84 (!) 124/99 112/74  Pulse: 93 (!) 103 (!) 116 96  Resp: (!) 24 (!) 27 (!) 25 (!) 29  Temp:      TempSrc:      SpO2: 100% 97% 98% 92%  Weight:      Height:        Intake/Output Summary (Last 24 hours) at 02/16/2020 0817 Last data filed at 02/16/2020 0700 Gross per 24 hour  Intake 3585.54 ml  Output 3500 ml  Net 85.54 ml   Last 3 Weights 02/12/2020 09/07/2019 03/18/2018  Weight (lbs) 187 lb 6.3 oz 185 lb 180 lb 3.2 oz  Weight (kg) 85 kg 83.915 kg 81.738 kg      Telemetry    NSR with runs of ectopic atrial rhythm - Personally Reviewed  ECG    NSR right axis. No acute ST-T changes- Personally Reviewed  Physical Exam   GEN: Thin BM in no acute distress.   Neck: No JVD, jugular vein is completely flat Cardiac: RRR, no murmurs, rubs, or gallops.  Respiratory: Clear to auscultation bilaterally. GI: Soft, nontender, non-distended  MS: No edema; No deformity. Neuro:  Nonfocal  Psych: Normal affect   Labs    High Sensitivity Troponin:  No results for input(s): TROPONINIHS in the last 720 hours.    Chemistry Recent  Labs  Lab 02/13/20 1231 02/14/20 0602 02/15/20 0627 02/15/20 0754 02/15/20 1552 02/16/20 0054  NA 137   < > 133* 135  --  136  K 5.1   < > 6.6* 5.8* 4.9 5.4*  CL 100   < > 103 104  --  104  CO2 23   < > 18* 20*  --  16*  GLUCOSE 84   < > 144* 149*  --  158*  BUN 18   < > 24* 24*  --  33*  CREATININE 1.20   < > 1.13 1.16  --  1.16  CALCIUM 10.3   < > 8.9 9.1  --  8.9  PROT 8.0  --  7.5  --   --   --   ALBUMIN 4.2  --  3.8  --   --   --   AST 29  --  29  --   --   --   ALT 31  --  27  --   --   --   ALKPHOS 54  --  49  --   --   --   BILITOT 1.0  --  1.0  --   --   --   GFRNONAA >  60   < > >60 >60  --  >60  ANIONGAP 14   < > 12 11  --  16*   < > = values in this interval not displayed.     Hematology Recent Labs  Lab 02/14/20 0602 02/15/20 0627 02/16/20 0054  WBC 29.6* 30.0* 15.9*  RBC 4.71 5.43 5.65  HGB 13.5 15.8 15.8  HCT 40.1 46.1 47.3  MCV 85.1 84.9 83.7  MCH 28.7 29.1 28.0  MCHC 33.7 34.3 33.4  RDW 13.0 13.1 14.2  PLT 178 189 PLATELET CLUMPS NOTED ON SMEAR, UNABLE TO ESTIMATE    BNPNo results for input(s): BNP, PROBNP in the last 168 hours.   DDimer No results for input(s): DDIMER in the last 168 hours.   Radiology    DG Chest 1 View  Result Date: 02/14/2020 CLINICAL DATA:  Post right thoracentesis EXAM: CHEST  1 VIEW COMPARISON:  CT from the previous day FINDINGS: No pneumothorax. Large mass centered in the right hilum extending into the right lower chest and to the left hilum as before. Heart size difficult to assess due to adjacent opacity. Cannot exclude small residual effusions. Visualized bones unremarkable. IMPRESSION: No pneumothorax post  right thoracentesis Electronically Signed   By: Lucrezia Europe M.D.   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  ANESTHESIA/SEDATION: Moderate (conscious) sedation was employed during this procedure. A total of Versed 1.0 mg and Fentanyl 0 mcg was administered intravenously. Moderate Sedation Time: 0 minutes. The patient's level of consciousness and vital signs were monitored continuously by radiology nursing throughout the procedure under my direct supervision. FLUOROSCOPY TIME:  CT COMPLICATIONS: None PROCEDURE: The procedure, risks, benefits, and alternatives were explained to the patient and the patient's family. Specific risks that were addressed included bleeding, infection, pneumothorax, need for further procedure including chest tube placement, chance of delayed pneumothorax or hemorrhage, hemoptysis, nondiagnostic sample, cardiopulmonary collapse, death. Questions regarding the procedure were encouraged and answered. The patient understands and consents to the procedure. Patient was positioned in a reclined, supine position on the CT gantry table and a scout CT of the chest was performed for planning purposes. Once angle of approach was determined, the skin and subcutaneous tissues this scan was prepped and draped in the usual sterile fashion, and a sterile drape was applied covering the operative field. A sterile gown and sterile gloves were used for the procedure. Local anesthesia was provided with 1% Lidocaine. The skin and subcutaneous tissues were infiltrated 1% lidocaine for local anesthesia, and a small stab incision was made with an 11 blade scalpel. Using CT guidance, a 15 gauge trocar needle was advanced into the mediastinal masstarget. After confirmation of the tip, separate 16 gauge core biopsies were performed. These were placed into saline solution for transportation to the lab. Biosentry Device was deployed. Patient tolerated the procedure well and remained hemodynamically stable throughout. No complications were encountered and no significant blood loss was encounter IMPRESSION: Status post CT-guided  biopsy of mediastinal mass. Signed, Dulcy Fanny. Dellia Nims, RPVI [facility] Electronically Signed   By: Corrie Mckusick D.O.   On: 02/15/2020 12:22   CT ABDOMEN PELVIS W CONTRAST  Result Date: 02/15/2020 CLINICAL DATA:  24 year old male with history of large mediastinal mass. Evaluate for lymphadenopathy in the abdomen or pelvis. EXAM: CT ABDOMEN AND PELVIS  WITH CONTRAST TECHNIQUE: Multidetector CT imaging of the abdomen and pelvis was performed using the standard protocol following bolus administration of intravenous contrast. CONTRAST:  174mL OMNIPAQUE IOHEXOL 300 MG/ML  SOLN COMPARISON:  Chest CT 02/13/2020. CT the abdomen and pelvis 09/07/2019. FINDINGS: Lower chest: Large anterior mediastinal mass and right-sided pleural effusion, similar to recent chest CT (please see report for chest CT 02/13/2020 for full description). New trace left pleural effusion also noted. Hepatobiliary: No suspicious cystic or solid hepatic lesions. No intra or extrahepatic biliary ductal dilatation. Intermediate attenuation material lying dependently in the gallbladder, compatible with biliary sludge. No findings to suggest an acute cholecystitis at this time. Pancreas: No pancreatic mass. No pancreatic ductal dilatation. No pancreatic or peripancreatic fluid collections or inflammatory changes. Spleen: Normal in size, and otherwise unremarkable. Adrenals/Urinary Tract: Subcentimeter low-attenuation lesions in both kidneys, too small to definitively characterize, but statistically likely to represent tiny cysts. No hydroureteronephrosis. Urinary bladder is normal in appearance. Bilateral adrenal glands are normal in appearance. Stomach/Bowel: Normal appearance of the stomach. No pathologic dilatation of small bowel or colon. The appendix is not confidently identified and may be surgically absent. Regardless, there are no inflammatory changes noted adjacent to the cecum to  suggest the presence of an acute appendicitis at this time. Vascular/Lymphatic: No aneurysm identified in the visualized abdominal vasculature. No definite lymphadenopathy identified in the abdomen or pelvis. Reproductive: Prostate gland and seminal vesicles are unremarkable in appearance. Other: No significant volume of ascites.  No pneumoperitoneum. Musculoskeletal: There are no aggressive appearing lytic or blastic lesions noted in the visualized portions of the skeleton. IMPRESSION: 1. No definite lymphadenopathy identified in the abdomen or pelvis. 2. No splenomegaly. 3. Large anterior mediastinal mass and large right pleural effusion, similar to the prior study. New trace left pleural effusion lying dependently. Electronically Signed   By: Vinnie Langton M.D.   On: 02/15/2020 12:12   DG Chest Port 1 View  Result Date: 02/14/2020 CLINICAL DATA:  Worsening shortness of breath after thoracentesis EXAM: PORTABLE CHEST 1 VIEW COMPARISON:  02/14/2020, CT 02/13/2020 FINDINGS: Small bilateral pleural effusions, mildly decreased on the right side. Airspace disease at both bases. Abnormal cardiomediastinal silhouette corresponding to large mediastinal mass on prior imaging. No pneumothorax is seen. IMPRESSION: 1. Decreased size of right pleural effusion without pneumothorax. Persistent small bilateral pleural effusions and bibasilar airspace disease. 2. Large mediastinal mass. Electronically Signed   By: Donavan Foil M.D.   On: 02/14/2020 18:19   ECHOCARDIOGRAM COMPLETE  Result Date: 02/14/2020    ECHOCARDIOGRAM REPORT   Patient Name:   SHERON ROBIN Edgerly Date of Exam: 02/14/2020 Medical Rec #:  [id number]      Height:       76.0 in Accession #:    [id number]     Weight:       187.4 lb Date of Birth:  1995/08/02      BSA:          2.155 m Patient Age:    24 years       BP:           113/61 mmHg Patient Gender: M              HR:           90 bpm. Exam Location:  Inpatient Procedure: 2D Echo, Cardiac Doppler and  Color Doppler Indications:    Pericardial effusion 423.9 / I31.3  History:        Patient has no prior history  of Echocardiogram examinations.                 Risk Factors:Non-Smoker.  Sonographer:    Vickie Epley RDCS Referring Phys: [place]  1. Left ventricular ejection fraction, by estimation, is 60 to 65%. The left ventricle has normal function. The left ventricle has no regional wall motion abnormalities. Left ventricular diastolic function could not be evaluated.  2. Right ventricular systolic function is normal. The right ventricular size is normal. Tricuspid regurgitation signal is inadequate for assessing PA pressure.  3. There is a moderate to large circumferential pericardial effusion measuring 2.4cm at greatest diameter anteriorly over the RV free wall and apex. The apical 4 chamber view is forshortened so not accurate assessment of MV inflow. There is significant RA diastolic collapse and intermittent RV diastolic collapse. The IVC is not well visualized. Worrisome echo for at least pre tamponade. Clincal correlation recommended.  4. The mitral valve is normal in structure. No evidence of mitral valve regurgitation. No evidence of mitral stenosis.  5. The aortic valve is normal in structure. Aortic valve regurgitation is not visualized. No aortic stenosis is present.  6. Aortic dilatation noted. There is borderline dilatation of the aortic root, measuring 37 mm.  7. The inferior vena cava is normal in size with greater than 50% respiratory variability, suggesting right atrial pressure of 3 mmHg.  8. Findings discussed with Dr. Sherry Ruffing and recommend Cardiology consult. FINDINGS  Left Ventricle: Left ventricular ejection fraction, by estimation, is 60 to 65%. The left ventricle has normal function. The left ventricle has no regional wall motion abnormalities. The left ventricular internal cavity size was normal in size. There is  no left ventricular hypertrophy. Left ventricular  diastolic function could not be evaluated. Right Ventricle: The right ventricular size is normal. No increase in right ventricular wall thickness. Right ventricular systolic function is normal. Tricuspid regurgitation signal is inadequate for assessing PA pressure. Left Atrium: Left atrial size was normal in size. Right Atrium: Right atrial size was normal in size. Pericardium: There is a moderate to large circumferential pericardial effusion measuring 2.4cm at greatest diameter anteriorly over the RV free wall and apex. The apical 4 chamber view is forshortened so not accurate assessment of MV inflow. There is significant RA diastolic collapse and intermittent RV diastolic collapse. The IVC is not well visualized. Worrisome echo for at least pre tamponade. Clincal correlation recommended. Report was called to Dr. Loni Muse large pericardial effusion is present. The pericardial effusion is circumferential. There is diastolic collapse of the right atrial wall and diastolic collapse of the right ventricular free wall. Mitral Valve: The mitral valve is normal in structure. No evidence of mitral valve regurgitation. No evidence of mitral valve stenosis. Tricuspid Valve: The tricuspid valve is normal in structure. Tricuspid valve regurgitation is trivial. No evidence of tricuspid stenosis. Aortic Valve: The aortic valve is normal in structure. Aortic valve regurgitation is not visualized. No aortic stenosis is present. Pulmonic Valve: The pulmonic valve was normal in structure. Pulmonic valve regurgitation is trivial. No evidence of pulmonic stenosis. Aorta: Aortic dilatation noted. There is borderline dilatation of the aortic root, measuring 37 mm. Venous: The inferior vena cava was not well visualized. The inferior vena cava is normal in size with greater than 50% respiratory variability, suggesting right atrial pressure of 3 mmHg. IAS/Shunts: No atrial level shunt detected by color flow Doppler. Additional Comments: There is  a moderate pleural effusion in the left lateral region.  LEFT  VENTRICLE PLAX 2D LVOT diam:     2.90 cm LV SV:         73 LV SV Index:   34 LVOT Area:     6.61 cm  LV Volumes (MOD) LV vol d, MOD A4C: 161.0 ml LV vol s, MOD A4C: 92.0 ml LV SV MOD A4C:     161.0 ml RIGHT VENTRICLE TAPSE (M-mode): 1.2 cm LEFT ATRIUM           Index       RIGHT ATRIUM          Index LA Vol (A4C): 24.9 ml 11.56 ml/m RA Area:     6.84 cm                                   RA Volume:   8.85 ml  4.11 ml/m  AORTIC VALVE LVOT Vmax:   70.20 cm/s LVOT Vmean:  50.200 cm/s LVOT VTI:    0.111 m  AORTA Ao Root diam: 3.70 cm Ao Asc diam:  3.10 cm MITRAL VALVE               TRICUSPID VALVE MV Area (PHT): 3.74 cm    TR Peak grad:   12.4 mmHg MV Decel Time: 203 msec    TR Vmax:        176.00 cm/s MV E velocity: 73.30 cm/s MV A velocity: 80.60 cm/s  SHUNTS MV E/A ratio:  0.91        Systemic VTI:  0.11 m                            Systemic Diam: 2.90 cm Fransico Him MD Electronically signed by Fransico Him MD Signature Date/Time: 02/14/2020/1:33:44 PM    Final    IR THORACENTESIS ASP PLEURAL SPACE W/IMG GUIDE  Result Date: 02/14/2020 INDICATION: Patient with history of mediastinal mass, dyspnea, and right pleural effusion. Request made for diagnostic and therapeutic right thoracentesis. EXAM: ULTRASOUND GUIDED DIAGNOSTIC AND THERAPEUTIC RIGHT THORACENTESIS MEDICATIONS: 10 mL 1% lidocaine COMPLICATIONS: None immediate. PROCEDURE: An ultrasound guided thoracentesis was thoroughly discussed with the patient and questions answered. The benefits, risks, alternatives and complications were also discussed. The patient understands and wishes to proceed with the procedure. Written consent was obtained. Ultrasound was performed to localize and mark an adequate pocket of fluid in the right chest. The area was then prepped and draped in the normal sterile fashion. 1% Lidocaine was used for local anesthesia. Under ultrasound guidance a 6 Fr Safe-T-Centesis  catheter was introduced. Thoracentesis was performed. The catheter was removed and a dressing applied. FINDINGS: A total of approximately 1.2 L of hazy gold fluid was removed. Samples were sent to the laboratory as requested by the clinical team. IMPRESSION: Successful ultrasound guided right thoracentesis yielding 1.2 L of pleural fluid. Read by: Earley Abide, PA-C Electronically Signed   By: Jacqulynn Cadet M.D.   On: 02/14/2020 15:38    Cardiac Studies   Echo 02/15/20: IMPRESSIONS    1. Left ventricular ejection fraction, by estimation, is 60 to 65%. The  left ventricle has normal function. The left ventricle has no regional  wall motion abnormalities. Left ventricular diastolic function could not  be evaluated.  2. Right ventricular systolic function is normal. The right ventricular  size is normal. Tricuspid regurgitation signal is inadequate for assessing  PA pressure.  3. There  is a moderate to large circumferential pericardial effusion  measuring 2.4cm at greatest diameter anteriorly over the RV free wall and  apex. The apical 4 chamber view is forshortened so not accurate assessment  of MV inflow. There is significant  RA diastolic collapse and intermittent RV diastolic collapse. The IVC is  not well visualized. Worrisome echo for at least pre tamponade. Clincal  correlation recommended.  4. The mitral valve is normal in structure. No evidence of mitral valve  regurgitation. No evidence of mitral stenosis.  5. The aortic valve is normal in structure. Aortic valve regurgitation is  not visualized. No aortic stenosis is present.  6. Aortic dilatation noted. There is borderline dilatation of the aortic  root, measuring 37 mm.  7. The inferior vena cava is normal in size with greater than 50%  respiratory variability, suggesting right atrial pressure of 3 mmHg.  8. Findings discussed with Dr. Sherry Ruffing and recommend Cardiology consult.   Patient Profile     24 y.o.  male  With large mediastinal mass and pleural and pericardial effusions  Assessment & Plan    1. Large anterior mediastinal mass.s/p IR guided biopsy of mass yesterday. Awaiting pathology results.  2. Pericardial effusion. No evidence of tamponade.  Jugular veins are flat. BP stable. Not a candidate for pericardial tap. If he does develop tamponade/clinical deterioration would need surgical window. Fortunately CT yesterday showed no increase in pericardial effusion. 3. Ectopic atrial rhythm/tachycardia. Paroxysmal- infrequent and asymptomatic.  Will monitor. Avoid beta blocker for now to avoid hypotension 4. Pleural effusions. R>L s/p thoracentesis on the right   We will continue to monitor peripherally. Please call with any cardiac concerns  [facility] will sign off.   Medication Recommendations:  Per [facility] Other recommendations (labs, testing, etc):  none Follow up as an outpatient:  N/A       For questions or updates, please contact [facility] Please consult [email or web address] for contact info under        Signed, Braelyn Jenson [place], MD  02/16/2020, 8:17 AM

## 2020-02-16 NOTE — Progress Notes (Signed)
RT assessed pt. Pt's SpO2 while on NRB were 95%. Per MD, wean oxygen as tolerated. RT placed pt on 10L [place]. Pt tolerated well. RT will continue to monitor pt.

## 2020-02-16 NOTE — Progress Notes (Addendum)
Report given via phone to Douglass Rivers, RN at 2030 from [facility] ICU.

## 2020-02-16 NOTE — Progress Notes (Signed)
RN called RT stating pt had taken himself off BiPAP. RT at bedside to find pt sitting on side of bed with NRB at 15L. Per MD and RN pt had thrown up during the time off BiPAP. Pt tolerating NRB well with SpO2 now at 96%. RT asked pt if he would prefer NRB or [place], pt stated he would like to keep the NRB on. RT will continue to monitor pt.

## 2020-02-16 NOTE — Progress Notes (Signed)
Pt transported off unit to go to [facility] via [facility] transport services. All pertinent papers (incl. EMTALA and medical necessity forms) given to [facility] staff. Pt's family to travel w/pt.

## 2020-02-16 NOTE — Progress Notes (Addendum)
HEMATOLOGY-ONCOLOGY PROGRESS NOTE  SUBJECTIVE: Events from this morning noted. Required increased O2 and was a on BIPAP which he did not tolerate. Currently on O2 via [place] at 10 L/min. Sitting up in the recliner. Mother, father, and sister at bedside.   REVIEW OF SYSTEMS:   Constitutional: Denies fevers and chills Eyes: Denies blurriness of vision Ears, nose, mouth, throat, and face: Denies mucositis or sore throat Respiratory: More short of breath today Cardiovascular: Denies palpitation, chest discomfort Gastrointestinal:  Denies nausea, heartburn or change in bowel habits Skin: Denies abnormal skin rashes Lymphatics: Denies new lymphadenopathy or easy bruising Neurological:Denies numbness, tingling or new weaknesses Behavioral/Psych: Mood is stable, no new changes  Extremities: No lower extremity edema All other systems were reviewed with the patient and are negative.  I have reviewed the past medical history, past surgical history, social history and family history with the patient and they are unchanged from previous note.   PHYSICAL EXAMINATION: ECOG PERFORMANCE STATUS: 1 - Symptomatic but completely ambulatory  Vitals:   02/16/20 1330 02/16/20 1400  BP: 126/86 136/90  Pulse: (!) 43 (!) 53  Resp: (!) 22 (!) 29  Temp:    SpO2: 95% 90%   Filed Weights   02/12/20 2238  Weight: 85 kg    Intake/Output from previous day: 12/16 0701 - 12/17 0700 In: 3585.5 [P.O.:1080; I.V.:2403.5; IV Piggyback:102] Out: 3500 [Urine:3500]  GENERAL:Alert, mild increased work of breathing SKIN: skin color, texture, turgor are normal, no rashes or significant lesions LYMPH:  no palpable lymphadenopathy in the cervical, axillary or inguinal LUNGS: Mildly tachypnea, Diminished breath sounds on the right HEART: regular rate & rhythm and no murmurs and no lower extremity edema ABDOMEN:abdomen soft, non-tender and normal bowel sounds Musculoskeletal:no cyanosis of digits and no clubbing  NEURO:  alert & oriented x 3 with fluent speech, no focal motor/sensory deficits  LABORATORY DATA:  I have reviewed the data as listed CMP Latest Ref Rng & Units 02/16/2020 02/16/2020 02/15/2020  Glucose 70 - 99 mg/dL 154(H) 158(H) -  BUN 6 - 20 mg/dL 35(H) 33(H) -  Creatinine 0.61 - 1.24 mg/dL 1.06 1.16 -  Sodium 135 - 145 mmol/L 134(L) 136 -  Potassium 3.5 - 5.1 mmol/L 5.4(H) 5.4(H) 4.9  Chloride 98 - 111 mmol/L 105 104 -  CO2 22 - 32 mmol/L 18(L) 16(L) -  Calcium 8.9 - 10.3 mg/dL 8.3(L) 8.9 -  Total Protein 6.5 - 8.1 g/dL - - -  Total Bilirubin 0.3 - 1.2 mg/dL - - -  Alkaline Phos 38 - 126 U/L - - -  AST 15 - 41 U/L - - -  ALT 0 - 44 U/L - - -    Lab Results  Component Value Date   WBC 15.9 (H) 02/16/2020   HGB 15.8 02/16/2020   HCT 47.3 02/16/2020   MCV 83.7 02/16/2020   PLT PLATELET CLUMPS NOTED ON SMEAR, UNABLE TO ESTIMATE 02/16/2020   NEUTROABS 9.2 (H) 02/16/2020    DG Chest 1 View  Result Date: 02/14/2020 CLINICAL DATA:  Post right thoracentesis EXAM: CHEST  1 VIEW COMPARISON:  CT from the previous day FINDINGS: No pneumothorax. Large mass centered in the right hilum extending into the right lower chest and to the left hilum as before. Heart size difficult to assess due to adjacent opacity. Cannot exclude small residual effusions. Visualized bones unremarkable. IMPRESSION: No pneumothorax post  right thoracentesis Electronically Signed   By: Lucrezia Europe M.D.   On: 02/14/2020 16:57   CT ANGIO  CHEST PE W OR WO CONTRAST  Result Date: 02/13/2020 CLINICAL DATA:  Chest congestion and dry cough for the past week. EXAM: CT ANGIOGRAPHY CHEST WITH CONTRAST TECHNIQUE: Multidetector CT imaging of the chest was performed using the standard protocol during bolus administration of intravenous contrast. Multiplanar CT image reconstructions and MIPs were obtained to evaluate the vascular anatomy. CONTRAST:  35mL OMNIPAQUE IOHEXOL 350 MG/ML SOLN COMPARISON:  MR chest dated September 21, 2019. CT chest  dated September 07, 2019. FINDINGS: Cardiovascular: Satisfactory opacification of the pulmonary arteries to the segmental level. No evidence of pulmonary embolism. Narrowing of the right pulmonary artery due to mediastinal mass effect. Normal heart size, displaced to the left. New small pericardial effusion. No thoracic aortic aneurysm or dissection. 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 The mass abuts the right side of the heart and superior vena cava, which is narrowed distally. There is also narrowing of the right pulmonary artery and right mainstem bronchus. The mass extends superiorly to the base of the great vessels and also now appears to invade the right upper lobe (series 6, image 59). New enlarged right hilar lymph nodes measuring up to 1.4 cm in short axis. No enlarged mediastinal or axillary lymph nodes. The thyroid gland, trachea, and esophagus demonstrate no significant findings. Lungs/Pleura: New moderate right pleural effusion, mostly subpulmonic. New trace left pleural effusion. New atelectasis in the right upper and lower lobes. No consolidation or pneumothorax. Upper Abdomen: No acute abnormality. Musculoskeletal: No chest wall abnormality. No acute or significant osseous findings. Review of the MIP images confirms the above findings. IMPRESSION: 1. No evidence of pulmonary embolism. 2. 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 Differential considerations include thymic neoplasm, lymphoma, or germ-cell tumor. 3. New moderate right and trace left pleural effusions. 4. New small pericardial  effusion. Electronically Signed   By: Titus Dubin M.D.   On: 02/13/2020 15:05   CT guided needle placement  Result Date: 02/15/2020 INDICATION: 24 year old male with mediastinal mass concerning for lymphoma referred for biopsy EXAM: CT-GUIDED BIOPSY OF MEDIASTINAL MASS MEDICATIONS: None. ANESTHESIA/SEDATION: Moderate (conscious) sedation was employed during this procedure. A total of Versed 1.0 mg and Fentanyl 0 mcg was administered intravenously. Moderate Sedation Time: 0 minutes. The patient's level of consciousness and vital signs were monitored continuously by radiology nursing throughout the procedure under my direct supervision. FLUOROSCOPY TIME:  CT COMPLICATIONS: None PROCEDURE: The procedure, risks, benefits, and alternatives were explained to the patient and the patient's family. Specific risks that were addressed included bleeding, infection, pneumothorax, need for further procedure including chest tube placement, chance of delayed pneumothorax or hemorrhage, hemoptysis, nondiagnostic sample, cardiopulmonary collapse, death. Questions regarding the procedure were encouraged and answered. The patient understands and consents to the procedure. Patient was positioned in a reclined, supine position on the CT gantry table and a scout CT of the chest was performed for planning purposes. Once angle of approach was determined, the skin and subcutaneous tissues this scan was prepped and draped in the usual sterile fashion, and a sterile drape was applied covering the operative field. A sterile gown and sterile gloves were used for the  procedure. Local anesthesia was provided with 1% Lidocaine. The skin and subcutaneous tissues were infiltrated 1% lidocaine for local anesthesia, and a small stab incision was made with an 11 blade scalpel. Using CT guidance, a 15 gauge trocar needle was advanced into the mediastinal masstarget. After confirmation of the tip, separate 16 gauge core biopsies were performed.  These were placed into saline solution for transportation to the lab. Biosentry Device was deployed. Patient tolerated the procedure well and remained hemodynamically stable throughout. No complications were encountered and no significant blood loss was encounter IMPRESSION: Status post CT-guided biopsy of mediastinal mass. Signed, Dulcy Fanny. Dellia Nims, RPVI [facility] Electronically Signed   By: Corrie Mckusick D.O.   On: 02/15/2020 12:22   CT ABDOMEN PELVIS W CONTRAST  Result Date: 02/15/2020 CLINICAL DATA:  24 year old male with history of large mediastinal mass. Evaluate for lymphadenopathy in the abdomen or pelvis. EXAM: CT ABDOMEN AND PELVIS WITH CONTRAST TECHNIQUE: Multidetector CT imaging of the abdomen and pelvis was performed using the standard protocol following bolus administration of intravenous contrast. CONTRAST:  187mL OMNIPAQUE IOHEXOL 300 MG/ML  SOLN COMPARISON:  Chest CT 02/13/2020. CT the abdomen and pelvis 09/07/2019. FINDINGS: Lower chest: Large anterior mediastinal mass and right-sided pleural effusion, similar to recent chest CT (please see report for chest CT 02/13/2020 for full description). New trace left pleural effusion also noted. Hepatobiliary: No suspicious cystic or solid hepatic lesions. No intra or extrahepatic biliary ductal dilatation. Intermediate attenuation material lying dependently in the gallbladder, compatible with biliary sludge. No findings to suggest an acute cholecystitis at this time. Pancreas: No pancreatic mass. No pancreatic ductal dilatation. No pancreatic or peripancreatic fluid collections or inflammatory changes. Spleen: Normal in size, and otherwise unremarkable. Adrenals/Urinary Tract: Subcentimeter low-attenuation lesions in both kidneys, too small to definitively characterize, but statistically likely to represent tiny cysts. No hydroureteronephrosis. Urinary bladder is normal in appearance.  Bilateral adrenal glands are normal in appearance. Stomach/Bowel: Normal appearance of the stomach. No pathologic dilatation of small bowel or colon. The appendix is not confidently identified and may be surgically absent. Regardless, there are no inflammatory changes noted adjacent to the cecum to suggest the presence of an acute appendicitis at this time. Vascular/Lymphatic: No aneurysm identified in the visualized abdominal vasculature. No definite lymphadenopathy identified in the abdomen or pelvis. Reproductive: Prostate gland and seminal vesicles are unremarkable in appearance. Other: No significant volume of ascites.  No pneumoperitoneum. Musculoskeletal: There are no aggressive appearing lytic or blastic lesions noted in the visualized portions of the skeleton. IMPRESSION: 1. No definite lymphadenopathy identified in the abdomen or pelvis. 2. No splenomegaly. 3. Large anterior mediastinal mass and large right pleural effusion, similar to the prior study. New trace left pleural effusion lying dependently. Electronically Signed   By: Vinnie Langton M.D.   On: 02/15/2020 12:12   DG Chest Port 1 View  Result Date: 02/16/2020 CLINICAL DATA:  Shortness of breath EXAM: PORTABLE CHEST 1 VIEW COMPARISON:  02/14/2020 FINDINGS: Cardiac shadow is obscured by the known large mediastinal mass. Bilateral pleural effusions and bibasilar atelectatic changes are noted increased when compared with the prior exam. No pneumothorax is noted. No bony abnormality is seen. IMPRESSION: Stable large mediastinal mass. Bibasilar atelectasis and enlarging effusions are noted. Electronically Signed   By: Inez Catalina M.D.   On: 02/16/2020 09:11   DG Chest Port 1 View  Result Date: 02/14/2020 CLINICAL DATA:  Worsening shortness of breath after thoracentesis EXAM: PORTABLE CHEST  1 VIEW COMPARISON:  02/14/2020, CT 02/13/2020 FINDINGS: Small bilateral pleural effusions, mildly decreased on the right side. Airspace disease at both  bases. Abnormal cardiomediastinal silhouette corresponding to large mediastinal mass on prior imaging. No pneumothorax is seen. IMPRESSION: 1. Decreased size of right pleural effusion without pneumothorax. Persistent small bilateral pleural effusions and bibasilar airspace disease. 2. Large mediastinal mass. Electronically Signed   By: Jasmine Pang M.D.   On: 02/14/2020 18:19   ECHOCARDIOGRAM COMPLETE  Result Date: 02/14/2020    ECHOCARDIOGRAM REPORT   Patient Name:   Fernando Hickman Date of Exam: 02/14/2020 Medical Rec #:  [id number]      Height:       76.0 in Accession #:    [id number]     Weight:       187.4 lb Date of Birth:  1995/09/24      BSA:          2.155 m Patient Age:    24 years       BP:           113/61 mmHg Patient Gender: M              HR:           90 bpm. Exam Location:  Inpatient Procedure: 2D Echo, Cardiac Doppler and Color Doppler Indications:    Pericardial effusion 423.9 / I31.3  History:        Patient has no prior history of Echocardiogram examinations.                 Risk Factors:Non-Smoker.  Sonographer:    Renella Cunas RDCS Referring Phys: [id number] KRISTIN R CURCIO IMPRESSIONS  1. Left ventricular ejection fraction, by estimation, is 60 to 65%. The left ventricle has normal function. The left ventricle has no regional wall motion abnormalities. Left ventricular diastolic function could not be evaluated.  2. Right ventricular systolic function is normal. The right ventricular size is normal. Tricuspid regurgitation signal is inadequate for assessing PA pressure.  3. There is a moderate to large circumferential pericardial effusion measuring 2.4cm at greatest diameter anteriorly over the RV free wall and apex. The apical 4 chamber view is forshortened so not accurate assessment of MV inflow. There is significant RA diastolic collapse and intermittent RV diastolic collapse. The IVC is not well visualized. Worrisome echo for at least pre tamponade. Clincal correlation recommended.  4. The  mitral valve is normal in structure. No evidence of mitral valve regurgitation. No evidence of mitral stenosis.  5. The aortic valve is normal in structure. Aortic valve regurgitation is not visualized. No aortic stenosis is present.  6. Aortic dilatation noted. There is borderline dilatation of the aortic root, measuring 37 mm.  7. The inferior vena cava is normal in size with greater than 50% respiratory variability, suggesting right atrial pressure of 3 mmHg.  8. Findings discussed with Dr. Gwyneth Revels and recommend Cardiology consult. FINDINGS  Left Ventricle: Left ventricular ejection fraction, by estimation, is 60 to 65%. The left ventricle has normal function. The left ventricle has no regional wall motion abnormalities. The left ventricular internal cavity size was normal in size. There is  no left ventricular hypertrophy. Left ventricular diastolic function could not be evaluated. Right Ventricle: The right ventricular size is normal. No increase in right ventricular wall thickness. Right ventricular systolic function is normal. Tricuspid regurgitation signal is inadequate for assessing PA pressure. Left Atrium: Left atrial size was normal in size. Right Atrium: Right atrial size  was normal in size. Pericardium: There is a moderate to large circumferential pericardial effusion measuring 2.4cm at greatest diameter anteriorly over the RV free wall and apex. The apical 4 chamber view is forshortened so not accurate assessment of MV inflow. There is significant RA diastolic collapse and intermittent RV diastolic collapse. The IVC is not well visualized. Worrisome echo for at least pre tamponade. Clincal correlation recommended. Report was called to Dr. Loni Muse large pericardial effusion is present. The pericardial effusion is circumferential. There is diastolic collapse of the right atrial wall and diastolic collapse of the right ventricular free wall. Mitral Valve: The mitral valve is normal in structure. No evidence of  mitral valve regurgitation. No evidence of mitral valve stenosis. Tricuspid Valve: The tricuspid valve is normal in structure. Tricuspid valve regurgitation is trivial. No evidence of tricuspid stenosis. Aortic Valve: The aortic valve is normal in structure. Aortic valve regurgitation is not visualized. No aortic stenosis is present. Pulmonic Valve: The pulmonic valve was normal in structure. Pulmonic valve regurgitation is trivial. No evidence of pulmonic stenosis. Aorta: Aortic dilatation noted. There is borderline dilatation of the aortic root, measuring 37 mm. Venous: The inferior vena cava was not well visualized. The inferior vena cava is normal in size with greater than 50% respiratory variability, suggesting right atrial pressure of 3 mmHg. IAS/Shunts: No atrial level shunt detected by color flow Doppler. Additional Comments: There is a moderate pleural effusion in the left lateral region.  LEFT VENTRICLE PLAX 2D LVOT diam:     2.90 cm LV SV:         73 LV SV Index:   34 LVOT Area:     6.61 cm  LV Volumes (MOD) LV vol d, MOD A4C: 161.0 ml LV vol s, MOD A4C: 92.0 ml LV SV MOD A4C:     161.0 ml RIGHT VENTRICLE TAPSE (M-mode): 1.2 cm LEFT ATRIUM           Index       RIGHT ATRIUM          Index LA Vol (A4C): 24.9 ml 11.56 ml/m RA Area:     6.84 cm                                   RA Volume:   8.85 ml  4.11 ml/m  AORTIC VALVE LVOT Vmax:   70.20 cm/s LVOT Vmean:  50.200 cm/s LVOT VTI:    0.111 m  AORTA Ao Root diam: 3.70 cm Ao Asc diam:  3.10 cm MITRAL VALVE               TRICUSPID VALVE MV Area (PHT): 3.74 cm    TR Peak grad:   12.4 mmHg MV Decel Time: 203 msec    TR Vmax:        176.00 cm/s MV E velocity: 73.30 cm/s MV A velocity: 80.60 cm/s  SHUNTS MV E/A ratio:  0.91        Systemic VTI:  0.11 m                            Systemic Diam: 2.90 cm Fransico Him MD Electronically signed by Fransico Him MD Signature Date/Time: 02/14/2020/1:33:44 PM    Final    IR THORACENTESIS ASP PLEURAL SPACE W/IMG  GUIDE  Result Date: 02/14/2020 INDICATION: Patient with history of mediastinal mass, dyspnea, and right pleural  effusion. Request made for diagnostic and therapeutic right thoracentesis. EXAM: ULTRASOUND GUIDED DIAGNOSTIC AND THERAPEUTIC RIGHT THORACENTESIS MEDICATIONS: 10 mL 1% lidocaine COMPLICATIONS: None immediate. PROCEDURE: An ultrasound guided thoracentesis was thoroughly discussed with the patient and questions answered. The benefits, risks, alternatives and complications were also discussed. The patient understands and wishes to proceed with the procedure. Written consent was obtained. Ultrasound was performed to localize and mark an adequate pocket of fluid in the right chest. The area was then prepped and draped in the normal sterile fashion. 1% Lidocaine was used for local anesthesia. Under ultrasound guidance a 6 Fr Safe-T-Centesis catheter was introduced. Thoracentesis was performed. The catheter was removed and a dressing applied. FINDINGS: A total of approximately 1.2 L of hazy gold fluid was removed. Samples were sent to the laboratory as requested by the clinical team. IMPRESSION: Successful ultrasound guided right thoracentesis yielding 1.2 L of pleural fluid. Read by: Earley Abide, PA-C Electronically Signed   By: Jacqulynn Cadet M.D.   On: 02/14/2020 15:38    ASSESSMENT AND PLAN: This is a 24 year old male with:  1.  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 -02/13/2020 CTA chest "1. No evidence of pulmonary embolism. 2. 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 Differential considerations include thymic neoplasm, lymphoma, or germ-cell tumor. 3. New moderate right and trace left pleural  effusions. 4. New small pericardial effusion." -02/13/2020 beta-hCG <1, AFP 4.1 -02/15/2020 CT abdomen/pelvis "1. No definite lymphadenopathy identified in the abdomen or pelvis. 2. No splenomegaly. 3. Large anterior mediastinal mass and large right pleural effusion, similar to the prior study. New trace left pleural effusion lying dependently." -02/13/2020 flow cytometry-"Dual CD4/CD8 positive T-cell population comprises 40% of all cellular events. Review of the peripheral blood shows atypical cells with a morphology  concerning for blasts. The flow cytometry shows a dual positive CD4/CD8  population without expression of CD3. 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 Additional tissue biopsy is pending (see [id number]).   -02/14/2020 ultrasound-guided thoracentesis with 1.2 L of fluid removed.  Cytology pending. -02/15/2020 biopsy of mediastinal mass.  Pathology results pending. -Discussed with Dr. Tresa Moore today. 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  -On dexamethasone 20 mg daily x3 days. -PCCM attempting to transfer to tertiary care center, but no ICU beds available.  -We have reached out to the leukemia service at [facility] (Dr. Zenda Alpers). If able to downgrade status from ICU, they will admit to leukemia service. Dr. Linus Orn will also discuss with his ICU team. Updated Dr. Lamonte Sakai. -If patient not able to be transferred soon, will need to consider stating chemo here with Hyper-CVAD. -Recommend bone marrow biopsy and LP when stable.   2.  Leukocytosis -Could be due to underlying bone marrow involvement from lymphoma/leukemia. Neutrophilia can also be seen with Hodgkins lymphoma.  Also on steroids. -Flow cytometry results as noted above -CT guided bone marrow aspiration and biopsy has been ordered.  Will perform when respiratory status stabilizes.   3.  Pericardial effusion -Small  pericardial effusion noted on CT scan but noted to be moderate to large on echocardiogram -Cardiology and cardiothoracic surgery are following.  4.  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 Monitor uric  acid daily.  -Continue Allopurinol 150 mg BID.   5.  Jehovah's Witness -The patient does not accept blood products.   LOS: 2 days   Mikey Bussing, DNP, AGPCNP-BC, AOCNP 02/16/20    ADDENDUM  .Patient was Personally and independently interviewed, examined and relevant elements of the history of present illness were reviewed in details and an assessment and plan was created. All elements of the patient's history of present illness , assessment and plan were discussed in details with Mikey Bussing, DNP, AGPCNP-BC, AOCNP. The above documentation reflects our combined findings assessment and plan.  Spent significant time with the patient with his father at bedside.  Patient has had respiratory instability this morning and needed intermittent BiPAP and is currently on oxygen.  Arousable awake and oriented x3.  Appears more fatigued. Direct ICU transfer bed was not initially available.  I discussed his case with Dr. Zenda Alpers at the [place] service and he was kind enough to work with the ICU team there to get Fernando Hickman a bed for transfer.  I discussed with Dr. Linus Orn and also with the patient and his father at bedside the significant limitations to treatment without transfusion support.  Patient and father reiterated their commitment avoiding any form of blood transfusion or stem cell transplantation as Jehovah's Witnesses with the understanding that this might have a significant bearing on his treatment outcomes.  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  Sullivan Lone MD MS Total time  spent 70 minutes more than 50% of the time on direct patient contact counseling and coordination of care with [facility] critical care and hematology oncology

## 2020-02-16 NOTE — Progress Notes (Addendum)
[facility] DAILY ICU PROGRESS NOTE                   [address]            [place] [address]          [phone]       Total Length of Stay:  LOS: 2 days   Subjective: Patient resting this am. He has no specific complaint.  Objective: Vital signs in last 24 hours: Temp:  [98 F (36.7 C)-98.3 F (36.8 C)] 98.3 F (36.8 C) (12/17 0318) Pulse Rate:  [45-119] 96 (12/17 0700) Cardiac Rhythm: Normal sinus rhythm;Sinus tachycardia (12/17 0400) Resp:  [16-34] 29 (12/17 0700) BP: (106-143)/(70-102) 112/74 (12/17 0700) SpO2:  [92 %-100 %] 92 % (12/17 0700)  Filed Weights   02/12/20 2238  Weight: 85 kg       Intake/Output from previous day: 12/16 0701 - 12/17 0700 In: 3585.5 [P.O.:1080; I.V.:2403.5; IV Piggyback:102] Out: 3500 [Urine:3500]  Intake/Output this shift: No intake/output data recorded.  Current Meds: Scheduled Meds: . allopurinol  150 mg Oral BID  . Chlorhexidine Gluconate Cloth  6 each Topical Daily  . dextromethorphan  15 mg Oral BID  . enoxaparin (LOVENOX) injection  40 mg Subcutaneous Q24H  . lanthanum  500 mg Oral TID WC  . sodium chloride flush  3 mL Intravenous Q12H   Continuous Infusions: . sodium chloride    . sodium chloride 125 mL/hr at 02/16/20 0700  . dexamethasone (DECADRON) IVPB ([facility]) Stopped (02/15/20 1528)   PRN Meds:.sodium chloride, benzonatate, sodium chloride flush  Heart: RRR Lungs: Slightly diminished bibasilar breath sounds   Lab Results: CBC: Recent Labs    02/15/20 0627 02/16/20 0054  WBC 30.0* 15.9*  HGB 15.8 15.8  HCT 46.1 47.3  PLT 189 PLATELET CLUMPS NOTED ON SMEAR, UNABLE TO ESTIMATE   BMET:  Recent Labs    02/15/20 0754 02/15/20 1552 02/16/20 0054  NA 135  --  136  K 5.8* 4.9 5.4*  CL 104  --  104  CO2 20*  --  16*  GLUCOSE 149*  --  158*  BUN 24*  --  33*  CREATININE 1.16  --  1.16  CALCIUM 9.1  --  8.9    CMET: Lab Results  Component Value Date   WBC 15.9 (H) 02/16/2020   HGB  15.8 02/16/2020   HCT 47.3 02/16/2020   PLT PLATELET CLUMPS NOTED ON SMEAR, UNABLE TO ESTIMATE 02/16/2020   GLUCOSE 158 (H) 02/16/2020   CHOL 120 10/08/2016   TRIG 58 10/08/2016   HDL 54 10/08/2016   LDLCALC 54 10/08/2016   ALT 27 02/15/2020   AST 29 02/15/2020   NA 136 02/16/2020   K 5.4 (H) 02/16/2020   CL 104 02/16/2020   CREATININE 1.16 02/16/2020   BUN 33 (H) 02/16/2020   CO2 16 (L) 02/16/2020   TSH 1.701 02/13/2020   INR 1.1 09/07/2019   HGBA1C 4.9 10/08/2016      PT/INR: No results for input(s): LABPROT, INR in the last 72 hours. Radiology: CT guided needle placement  Result Date: 02/15/2020 INDICATION: 24 year old male with mediastinal mass concerning for lymphoma referred for biopsy EXAM: CT-GUIDED BIOPSY OF MEDIASTINAL MASS MEDICATIONS: None. ANESTHESIA/SEDATION: Moderate (conscious) sedation was employed during this procedure. A total of Versed 1.0 mg and Fentanyl 0 mcg was administered intravenously. Moderate Sedation Time: 0 minutes. The patient's level of consciousness and vital signs were monitored continuously by radiology nursing throughout the  procedure under my direct supervision. FLUOROSCOPY TIME:  CT COMPLICATIONS: None PROCEDURE: The procedure, risks, benefits, and alternatives were explained to the patient and the patient's family. Specific risks that were addressed included bleeding, infection, pneumothorax, need for further procedure including chest tube placement, chance of delayed pneumothorax or hemorrhage, hemoptysis, nondiagnostic sample, cardiopulmonary collapse, death. Questions regarding the procedure were encouraged and answered. The patient understands and consents to the procedure. Patient was positioned in a reclined, supine position on the CT gantry table and a scout CT of the chest was performed for planning purposes. Once angle of approach was determined, the skin and subcutaneous tissues this scan was prepped and draped in the usual sterile fashion,  and a sterile drape was applied covering the operative field. A sterile gown and sterile gloves were used for the procedure. Local anesthesia was provided with 1% Lidocaine. The skin and subcutaneous tissues were infiltrated 1% lidocaine for local anesthesia, and a small stab incision was made with an 11 blade scalpel. Using CT guidance, a 15 gauge trocar needle was advanced into the mediastinal masstarget. After confirmation of the tip, separate 16 gauge core biopsies were performed. These were placed into saline solution for transportation to the lab. Biosentry Device was deployed. Patient tolerated the procedure well and remained hemodynamically stable throughout. No complications were encountered and no significant blood loss was encounter IMPRESSION: Status post CT-guided biopsy of mediastinal mass. Signed, Dulcy Fanny. Dellia Nims, RPVI [facility] Radiology Electronically Signed   By: Corrie Mckusick D.O.   On: 02/15/2020 12:22   CT ABDOMEN PELVIS W CONTRAST  Result Date: 02/15/2020 CLINICAL DATA:  24 year old male with history of large mediastinal mass. Evaluate for lymphadenopathy in the abdomen or pelvis. EXAM: CT ABDOMEN AND PELVIS WITH CONTRAST TECHNIQUE: Multidetector CT imaging of the abdomen and pelvis was performed using the standard protocol following bolus administration of intravenous contrast. CONTRAST:  121mL OMNIPAQUE IOHEXOL 300 MG/ML  SOLN COMPARISON:  Chest CT 02/13/2020. CT the abdomen and pelvis 09/07/2019. FINDINGS: Lower chest: Large anterior mediastinal mass and right-sided pleural effusion, similar to recent chest CT (please see report for chest CT 02/13/2020 for full description). New trace left pleural effusion also noted. Hepatobiliary: No suspicious cystic or solid hepatic lesions. No intra or extrahepatic biliary ductal dilatation. Intermediate attenuation material lying dependently in the gallbladder, compatible with biliary sludge.  No findings to suggest an acute cholecystitis at this time. Pancreas: No pancreatic mass. No pancreatic ductal dilatation. No pancreatic or peripancreatic fluid collections or inflammatory changes. Spleen: Normal in size, and otherwise unremarkable. Adrenals/Urinary Tract: Subcentimeter low-attenuation lesions in both kidneys, too small to definitively characterize, but statistically likely to represent tiny cysts. No hydroureteronephrosis. Urinary bladder is normal in appearance. Bilateral adrenal glands are normal in appearance. Stomach/Bowel: Normal appearance of the stomach. No pathologic dilatation of small bowel or colon. The appendix is not confidently identified and may be surgically absent. Regardless, there are no inflammatory changes noted adjacent to the cecum to suggest the presence of an acute appendicitis at this time. Vascular/Lymphatic: No aneurysm identified in the visualized abdominal vasculature. No definite lymphadenopathy identified in the abdomen or pelvis. Reproductive: Prostate gland and seminal vesicles are unremarkable in appearance. Other: No significant volume of ascites.  No pneumoperitoneum. Musculoskeletal: There are no aggressive appearing lytic or blastic lesions noted in the visualized portions of the skeleton. IMPRESSION: 1. No definite lymphadenopathy identified in the abdomen or pelvis. 2. No splenomegaly. 3. Large anterior mediastinal mass and large  right pleural effusion, similar to the prior study. New trace left pleural effusion lying dependently. Electronically Signed   By: Vinnie Langton M.D.   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 S/p CT guided biopsy by IR yesterday. Await biopsy results to start appropriate treatment. Hyperkalemia-potassium 5.4. Per primary Patient a [employer] and with large size of mediastinal mass and  involvement,  Management per CCM, medicine, oncology  Donielle Liston Alba PA-C 02/16/2020 7:40 AM   Waiting for full path results- Please call if can be of furhter help I have seen and examined Fernando Hickman and agree with the above assessment  and plan.  Grace Isaac MD Bee[facility] [phone] Office [phone] 02/16/2020 2:00 PM
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
## 2020-02-16 NOTE — Progress Notes (Signed)
NAME:  Fernando Hickman, MRN:  [id number], DOB:  [birth year], LOS: 2 ADMISSION DATE:  02/12/2020, CONSULTATION DATE:  02/14/20 REFERRING MD:  Marianna Payment - IMTS, CHIEF COMPLAINT:  SOB -- 12/15: tripod breathing  Brief History   24 yo with enlarging (very large) anterior mediastinal mass. More tenuous respiratory status after thoracentesis  History of present illness   25 yo M hx who presented to ED with 1 week of SOB dry cough, poor appetite, nightsweats, chills, chest tightness. 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 Significant progression since CT chest 08/2019 which was thought most likely to be benign lesion sugh as pericardial cyst. Admitted to IMTS 02/13/20, seen by Oncology in ED.  On 12/15/2, IR and CVTS consulted for biopsy (CT guided vs surgical biopsies respectively). Patient then developed more labored respiratory status, and PCCM consulted urgently to bedside.   Past Medical History  Childhood asthma Is Jehovah witness  [facility]   12/14 admitted for SOB, dry cough found to have very large mediastinal mass 12/15 IR and CVTS consulted for biopsy of mediastinal mass.  PCCM consulted for worsening respiratory status later in shift. Transferring to ICU on BiPAP.   Consults:  PCCM CVTS  IR  Procedures:  Thoracentesis 12/15 > 1.2 L hazy yellow fluid drained. Echocardiogram 12/15 > LVEF 60-65%. There is a moderate to large circumferential pericardial effusion  measuring 2.4cm at greatest diameter anteriorly over the RV free wall and  apex. The apical 4 chamber view is forshortened so not accurate assessment  of MV inflow. There is significant  RA diastolic collapse and intermittent RV diastolic collapse. The IVC is  not well visualized. Worrisome echo for at least pre tamponade.  Significant Diagnostic Tests:  12/14 CTA chest> Extremely large anterior mediastinal mass, extends into hemithorax R>L. Compression of airways. R pleural effusion trace L pleural  effusion small pericardial effusion. Personally reviewed.  Micro Data:  12/13 COVID neg  12/15 MRSA  Neg 12/15 right pleural fluid >>   Antimicrobials:  n/a  Interim history/subjective:   Tolerating some room air overnight with adequate saturations but this morning oxygen needs increasing, up to 7 L/min and then placed on BiPAP. Tolerating the BiPAP with difficulty, continues to be hypoxemic, wants to take it off  Objective   Blood pressure 126/88, pulse (!) 102, temperature 97.8 F (36.6 C), resp. rate (!) 26, height $RemoveBe'6\' 4"'CMPVjaWAp$  (1.93 m), weight 85 kg, SpO2 91 %.    Vent Mode: PCV;BIPAP FiO2 (%):  [100 %] 100 % Set Rate:  [16 bmp] 16 bmp PEEP:  [8 cmH20] 8 cmH20   Intake/Output Summary (Last 24 hours) at 02/16/2020 1007 Last data filed at 02/16/2020 0800 Gross per 24 hour  Intake 3585.54 ml  Output 3100 ml  Net 485.54 ml   Filed Weights   02/12/20 2238  Weight: 85 kg   Examination: General: Haffey thin adult man, frustrated, weak, mild increase in work of breathing HEENT: Oropharynx moist, no lesion Neuro: Awake, alert, answers questions, somewhat depressed affect, follows commands CV: Regular, borderline tachycardic, telemetry with some PVCs and then runs of bigeminy PULM: Mildly tachypneic, decreased bilateral breath sounds, no wheezing or crackles GI: Nondistended, hypoactive bowel sounds Extremities: Warm, no lower extremity edema Skin: No rash  Resolved Hospital Problem list     Assessment & Plan:   Acute hypoxemic respiratory failure: secondary to massive mediastinal mass and bilateral atelectasis, pleural effusions.  -Remains at high risk for intubation. -Titrate FiO2 as able, may be a  candidate for heated high flow nasal cannula. -BiPAP if he needs it, would probably help with his atelectasis although he has tolerated poorly and has had nausea, emesis which carries risk -Cough suppression  Mediastinal mass: very large rapidly growing filling both left and  right chest with multiple vessels encircled including compression of airways, SVC and encroaching RA -Appreciate TCTS and Oncology input -Await pleural fluid cytology, mass biopsy result to help guide therapeutic plans -Dexamethasone as ordered -Following tumor lysis labs, on allopurinol and IV fluids 125 cc/h, received rasburicase 12/16 -Repeat Lokelma 12/17 x 2 -Per oncology will need a bone marrow biopsy, will need staging LP -Likely role for radiation therapy but transfer to [facility] would be complicated given his cardiac compromise, the cardiac and cardiothoracic support that he needs which is available at [facility] recommendation is for him to go to a tertiary center, [place] to coordinate care.  No ICU beds available, no waiting list.  I believe that an oncology to oncology consultation with a tertiary center may be helpful.  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  Unfortunately, both [facility] and [facility] have no ICU beds and no waiting list.  Patient at this time does not want to proceed with trying [facility] for transfer and wishes to proceed with biopsy here.  IR contacted with update to proceed today. Both parents remain at bedside and updated.   Best practice (evaluated daily)   Diet: NPO for procedure Pain/Anxiety/Delirium protocol (if indicated): n/a VAP protocol (if indicated): NA DVT prophylaxis: Holding for now pending surgical  needs GI prophylaxis: NA Glucose control: NA Mobility: Seat upright  last date of multidisciplinary goals of care discussion --12/17 Family and staff present --status, goals discussed with patient's father and sister at bedside.  Patient's CC RN present Summary of discussion --continue all aggressive care Follow up goals of care discussion due 02/21/20 Code Status: Full Disposition: ICU  Labs   CBC: Recent Labs  Lab 02/13/20 1231 02/14/20 0602 02/15/20 0627 02/16/20 0054  WBC 28.4* 29.6* 30.0* 15.9*  NEUTROABS 23.3*  --  15.0* 9.2*  HGB 15.1 13.5 15.8 15.8  HCT 45.5 40.1 46.1 47.3  MCV 86.8 85.1 84.9 83.7  PLT 194 178 189 PLATELET CLUMPS NOTED ON SMEAR, UNABLE TO ESTIMATE    Basic Metabolic Panel: Recent Labs  Lab 02/14/20 0602 02/15/20 0627 02/15/20 0754 02/15/20 1552 02/16/20 0054 02/16/20 0842  NA 138 133* 135  --  136 134*  K 4.7 6.6* 5.8* 4.9 5.4* 5.4*  CL 104 103 104  --  104 105  CO2 23 18* 20*  --  16* 18*  GLUCOSE 96 144* 149*  --  158* 154*  BUN 19 24* 24*  --  33* 35*  CREATININE 1.19 1.13 1.16  --  1.16 1.06  CALCIUM 9.4 8.9 9.1  --  8.9 8.3*  MG  --   --  2.4  --   --   --   PHOS  --   --  5.7*  --   --   --  GFR: Estimated Creatinine Clearance: 130.3 mL/min (by C-G formula based on SCr of 1.06 mg/dL). Recent Labs  Lab 02/13/20 1231 02/14/20 0602 02/14/20 2039 02/15/20 0627 02/16/20 0054  WBC 28.4* 29.6*  --  30.0* 15.9*  LATICACIDVEN  --   --  3.2*  --   --     Liver Function Tests: Recent Labs  Lab 02/13/20 1231 02/15/20 0627  AST 29 29  ALT 31 27  ALKPHOS 54 49  BILITOT 1.0 1.0  PROT 8.0 7.5  ALBUMIN 4.2 3.8   No results for input(s): LIPASE, AMYLASE in the last 168 hours. No results for input(s): AMMONIA in the last 168 hours.  ABG    Component Value Date/Time   TCO2 22 09/07/2019 1837     Coagulation Profile: No results for input(s): INR, PROTIME in the last 168 hours.  Cardiac Enzymes: No results for input(s):  CKTOTAL, CKMB, CKMBINDEX, TROPONINI in the last 168 hours.  HbA1C: Hgb A1c MFr Bld  Date/Time Value Ref Range Status  10/08/2016 01:00 PM 4.9 4.8 - 5.6 % Final    Comment:             Pre-diabetes: 5.7 - 6.4          Diabetes: >6.4          Glycemic control for adults with diabetes: <7.0     CBG: Recent Labs  Lab 02/14/20 1735  GLUCAP 120*     Critical care time: 60 minutes    Baltazar Apo, MD, PhD 02/16/2020, 10:45 AM [facility] [phone] or if no answer [phone]

## 2020-02-16 NOTE — Progress Notes (Signed)
Report given to transport team. Pt transported on 15L non re-breather mask.  All belongings taken with patient/patient's family.   Notified [employer] laboratory technician of patient transport.

## 2020-02-16 NOTE — Progress Notes (Signed)
[facility] Progress Note Patient Name: JOEVANNI RODDEY DOB: 1995/07/10 MRN: [id number]   Date of Service  02/16/2020  HPI/Events of Note  Serum K+ 5.8 despite Lokelma.  eICU Interventions  Kayexalate 15 gm po x 1 ordered.        Kerry Kass Jesstin Studstill 02/16/2020, 8:13 PM

## 2020-02-17 DIAGNOSIS — C9100 Acute lymphoblastic leukemia not having achieved remission: Secondary | ICD-10-CM

## 2020-02-17 LAB — BODY FLUID CULTURE: Culture: NO GROWTH

## 2020-02-19 LAB — SURGICAL PATHOLOGY

## 2020-02-19 LAB — CYTOLOGY - NON PAP

## 2020-03-02 DEATH — deceased
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
